# Patient Record
Sex: Female | Born: 1935 | Race: White | Hispanic: No | Smoking: Former smoker
Health system: Southern US, Community
[De-identification: ages and names within clinical notes are randomized; demographics above are authoritative.]

## PROBLEM LIST (undated history)

## (undated) DIAGNOSIS — E785 Hyperlipidemia, unspecified: Secondary | ICD-10-CM

## (undated) DIAGNOSIS — I10 Essential (primary) hypertension: Secondary | ICD-10-CM

## (undated) DIAGNOSIS — Z720 Tobacco use: Secondary | ICD-10-CM

## (undated) HISTORY — DX: Hyperlipidemia, unspecified: E78.5

## (undated) HISTORY — DX: Tobacco use: Z72.0

## (undated) HISTORY — DX: Essential (primary) hypertension: I10

---

## 2003-06-28 ENCOUNTER — Other Ambulatory Visit: Admission: RE | Admit: 2003-06-28 | Discharge: 2003-06-28 | Payer: Self-pay | Admitting: Dermatology

## 2003-08-02 ENCOUNTER — Other Ambulatory Visit: Admission: RE | Admit: 2003-08-02 | Discharge: 2003-08-02 | Payer: Self-pay | Admitting: Dermatology

## 2003-10-07 ENCOUNTER — Ambulatory Visit (HOSPITAL_COMMUNITY): Admission: RE | Admit: 2003-10-07 | Discharge: 2003-10-07 | Payer: Self-pay | Admitting: Family Medicine

## 2003-11-10 ENCOUNTER — Emergency Department (HOSPITAL_COMMUNITY): Admission: EM | Admit: 2003-11-10 | Discharge: 2003-11-10 | Payer: Self-pay | Admitting: Emergency Medicine

## 2005-10-30 ENCOUNTER — Ambulatory Visit (HOSPITAL_COMMUNITY): Admission: RE | Admit: 2005-10-30 | Discharge: 2005-10-30 | Payer: Self-pay | Admitting: Family Medicine

## 2007-05-23 ENCOUNTER — Emergency Department (HOSPITAL_COMMUNITY): Admission: EM | Admit: 2007-05-23 | Discharge: 2007-05-24 | Payer: Self-pay | Admitting: Emergency Medicine

## 2007-06-24 ENCOUNTER — Encounter (HOSPITAL_COMMUNITY): Admission: RE | Admit: 2007-06-24 | Discharge: 2007-07-24 | Payer: Self-pay | Admitting: Family Medicine

## 2007-10-18 ENCOUNTER — Emergency Department (HOSPITAL_COMMUNITY): Admission: EM | Admit: 2007-10-18 | Discharge: 2007-10-18 | Payer: Self-pay | Admitting: Emergency Medicine

## 2010-05-23 ENCOUNTER — Emergency Department (HOSPITAL_COMMUNITY): Admission: EM | Admit: 2010-05-23 | Discharge: 2010-05-23 | Payer: Self-pay | Admitting: Emergency Medicine

## 2011-09-13 ENCOUNTER — Other Ambulatory Visit (HOSPITAL_COMMUNITY): Payer: Self-pay | Admitting: Physician Assistant

## 2011-09-13 DIAGNOSIS — Z139 Encounter for screening, unspecified: Secondary | ICD-10-CM

## 2011-09-17 ENCOUNTER — Ambulatory Visit (HOSPITAL_COMMUNITY)
Admission: RE | Admit: 2011-09-17 | Discharge: 2011-09-17 | Disposition: A | Discharge status: Home / Self Care | Payer: Medicare Other | Source: Ambulatory Visit | Attending: Physician Assistant | Admitting: Physician Assistant

## 2011-09-17 DIAGNOSIS — Z139 Encounter for screening, unspecified: Secondary | ICD-10-CM

## 2011-09-17 DIAGNOSIS — Z780 Asymptomatic menopausal state: Secondary | ICD-10-CM | POA: Insufficient documentation

## 2011-09-17 DIAGNOSIS — M818 Other osteoporosis without current pathological fracture: Secondary | ICD-10-CM | POA: Insufficient documentation

## 2013-08-05 ENCOUNTER — Ambulatory Visit (INDEPENDENT_AMBULATORY_CARE_PROVIDER_SITE_OTHER): Payer: Medicare Other | Admitting: Cardiovascular Disease

## 2013-08-05 ENCOUNTER — Encounter: Payer: Self-pay | Admitting: Cardiovascular Disease

## 2013-08-05 VITALS — BP 162/100 | HR 71 | Ht 61.5 in | Wt 123.0 lb

## 2013-08-05 DIAGNOSIS — I10 Essential (primary) hypertension: Secondary | ICD-10-CM

## 2013-08-05 DIAGNOSIS — E785 Hyperlipidemia, unspecified: Secondary | ICD-10-CM

## 2013-08-05 NOTE — Assessment & Plan Note (Signed)
On statin therapy. Her most recent lipid profile performed 09/19/11 revealed a glucose of 132 and LDL 66 and HDL of 42

## 2013-08-05 NOTE — Progress Notes (Signed)
08/05/2013 Whitney [name]   1929-09-30  [id number]  Primary Physician [name], MD Primary Cardiologist: Whitney Gess MD Whitney [name]   HPI:  [name] is a very pleasant 79 year old thin-appearing divorced Caucasian female with no children, whose brother-in-law is also a [name] of mine. I last saw her a year ago. Her problems include hypertension, hyperlipidemia, and continued tobacco abuse. She otherwise remains asymptomatic and denies chest pain or shortness of breath. Recent blood work performed earlier this year revealed total cholesterol of 132, LDL of 66 and HDL of 42.    Current Outpatient Prescriptions  Medication Sig Dispense Refill  . aspirin 81 MG tablet Take 81 mg by mouth daily.      [name] atorvastatin (LIPITOR) 20 MG tablet Take 10 mg by mouth daily.      [name] BENICAR 40 MG tablet Take 40 mg by mouth daily.      . Calcium Carb-Cholecalciferol (CALCIUM 500 +D) 500-400 MG-UNIT TABS Take 1 tablet by mouth 2 (two) times daily.      . cloNIDine (CATAPRES) 0.1 MG tablet Take 0.1 mg by mouth 2 (two) times daily.      . diazepam (VALIUM) 5 MG tablet Take 5 mg by mouth as needed.      . doxazosin (CARDURA) 2 MG tablet Take 2 mg by mouth at bedtime.      . hydrochlorothiazide (HYDRODIURIL) 25 MG tablet Take 25 mg by mouth daily.      . metoprolol succinate (TOPROL-XL) 100 MG 24 hr tablet Take 100 mg by mouth daily.      . Omega-3 Fatty Acids (FISH OIL) 1000 MG CAPS Take 1,000 mg by mouth. One tablet in the morning and two tablets at night       No current facility-administered medications for this visit.    No Known Allergies  History   Social History  . Marital Status: Divorced    Spouse Name: N/A    Number of Children: N/A  . Years of Education: N/A   Occupational History  . Not on file.   Social History Main Topics  . Smoking status: Current Every Day Smoker -- 1.00 packs/day    Types: Cigarettes  . Smokeless tobacco: Not on file    . Alcohol Use: Not on file  . Drug Use: Not on file  . Sexual Activity: Not on file   Other Topics Concern  . Not on file   Social History Narrative  . No narrative on file     Review of Systems: General: negative for chills, fever, night sweats or weight changes.  Cardiovascular: negative for chest pain, dyspnea on exertion, edema, orthopnea, palpitations, paroxysmal nocturnal dyspnea or shortness of breath Dermatological: negative for rash Respiratory: negative for cough or wheezing Urologic: negative for hematuria Abdominal: negative for nausea, vomiting, diarrhea, bright red blood per rectum, melena, or hematemesis Neurologic: negative for visual changes, syncope, or dizziness All other systems reviewed and are otherwise negative except as noted above.    Blood pressure 162/100, pulse 71, height 5' 1.5" (1.562 m), weight 123 lb (55.792 kg).  General appearance: alert and no distress Neck: no adenopathy, no carotid bruit, no JVD, supple, symmetrical, trachea midline and thyroid not enlarged, symmetric, no tenderness/mass/nodules Lungs: clear to auscultation bilaterally Heart: regular rate and rhythm, S1, S2 normal, no murmur, click, rub or gallop Extremities: extremities normal, atraumatic, no cyanosis or edema  EKG normal sinus rhythm at 71 with incomplete right bundle branch block  and left anterior fascicular block, and septal Q waves  ASSESSMENT AND PLAN:   Essential hypertension Her blood pressure is elevated today but she says this is a result of "white coat hypertension.  Hyperlipidemia On statin therapy. Her most recent lipid profile performed 09/19/11 revealed a glucose of 132 and LDL 66 and HDL of 42      Whitney Gess MD [facility], [facility] 08/05/2013 2:07 PM

## 2013-08-05 NOTE — Patient Instructions (Signed)
Your physician wants you to follow-up in: 1 year with Dr Berry. You will receive a reminder letter in the mail two months in advance. If you don't receive a letter, please call our office to schedule the follow-up appointment.  

## 2013-08-05 NOTE — Assessment & Plan Note (Signed)
Her blood pressure is elevated today but she says this is a result of "white coat hypertension.

## 2013-08-06 ENCOUNTER — Encounter: Payer: Self-pay | Admitting: Cardiovascular Disease

## 2013-12-25 ENCOUNTER — Emergency Department (HOSPITAL_COMMUNITY): Payer: Medicare Other

## 2013-12-25 ENCOUNTER — Encounter (HOSPITAL_COMMUNITY): Payer: Self-pay | Admitting: Emergency Medicine

## 2013-12-25 ENCOUNTER — Inpatient Hospital Stay (HOSPITAL_COMMUNITY)
Admission: EM | Admit: 2013-12-25 | Discharge: 2013-12-30 | DRG: 469 | Disposition: A | Discharge status: Skilled Nursing Facility | Payer: Medicare Other | Attending: Internal Medicine | Admitting: Internal Medicine

## 2013-12-25 DIAGNOSIS — Z720 Tobacco use: Secondary | ICD-10-CM

## 2013-12-25 DIAGNOSIS — E785 Hyperlipidemia, unspecified: Secondary | ICD-10-CM | POA: Diagnosis present

## 2013-12-25 DIAGNOSIS — Z01818 Encounter for other preprocedural examination: Secondary | ICD-10-CM

## 2013-12-25 DIAGNOSIS — W108XXA Fall (on) (from) other stairs and steps, initial encounter: Secondary | ICD-10-CM | POA: Diagnosis present

## 2013-12-25 DIAGNOSIS — J9601 Acute respiratory failure with hypoxia: Secondary | ICD-10-CM

## 2013-12-25 DIAGNOSIS — E8779 Other fluid overload: Secondary | ICD-10-CM | POA: Diagnosis not present

## 2013-12-25 DIAGNOSIS — Z79899 Other long term (current) drug therapy: Secondary | ICD-10-CM

## 2013-12-25 DIAGNOSIS — J9600 Acute respiratory failure, unspecified whether with hypoxia or hypercapnia: Secondary | ICD-10-CM | POA: Diagnosis not present

## 2013-12-25 DIAGNOSIS — S72002A Fracture of unspecified part of neck of left femur, initial encounter for closed fracture: Secondary | ICD-10-CM | POA: Diagnosis present

## 2013-12-25 DIAGNOSIS — R0902 Hypoxemia: Secondary | ICD-10-CM

## 2013-12-25 DIAGNOSIS — Z7982 Long term (current) use of aspirin: Secondary | ICD-10-CM

## 2013-12-25 DIAGNOSIS — I10 Essential (primary) hypertension: Secondary | ICD-10-CM

## 2013-12-25 DIAGNOSIS — J449 Chronic obstructive pulmonary disease, unspecified: Secondary | ICD-10-CM | POA: Diagnosis present

## 2013-12-25 DIAGNOSIS — D62 Acute posthemorrhagic anemia: Secondary | ICD-10-CM | POA: Diagnosis not present

## 2013-12-25 DIAGNOSIS — S72033A Displaced midcervical fracture of unspecified femur, initial encounter for closed fracture: Principal | ICD-10-CM | POA: Diagnosis present

## 2013-12-25 DIAGNOSIS — Y92009 Unspecified place in unspecified non-institutional (private) residence as the place of occurrence of the external cause: Secondary | ICD-10-CM

## 2013-12-25 DIAGNOSIS — F17200 Nicotine dependence, unspecified, uncomplicated: Secondary | ICD-10-CM | POA: Diagnosis present

## 2013-12-25 DIAGNOSIS — Z833 Family history of diabetes mellitus: Secondary | ICD-10-CM

## 2013-12-25 DIAGNOSIS — S72009A Fracture of unspecified part of neck of unspecified femur, initial encounter for closed fracture: Secondary | ICD-10-CM

## 2013-12-25 DIAGNOSIS — D500 Iron deficiency anemia secondary to blood loss (chronic): Secondary | ICD-10-CM | POA: Diagnosis not present

## 2013-12-25 DIAGNOSIS — J4489 Other specified chronic obstructive pulmonary disease: Secondary | ICD-10-CM | POA: Diagnosis present

## 2013-12-25 LAB — COMPREHENSIVE METABOLIC PANEL
ALT: 15 U/L (ref 0–35)
AST: 22 U/L (ref 0–37)
Albumin: 3.9 g/dL (ref 3.5–5.2)
Alkaline Phosphatase: 74 U/L (ref 39–117)
BILIRUBIN TOTAL: 0.6 mg/dL (ref 0.3–1.2)
BUN: 16 mg/dL (ref 6–23)
CHLORIDE: 93 meq/L — AB (ref 96–112)
CO2: 31 meq/L (ref 19–32)
CREATININE: 0.57 mg/dL (ref 0.50–1.10)
Calcium: 9.7 mg/dL (ref 8.4–10.5)
GFR calc Af Amer: >90 mL/min (ref >90)
GFR, EST NON AFRICAN AMERICAN: 85 mL/min — AB (ref >90)
GLUCOSE: 115 mg/dL — AB (ref 70–99)
Potassium: 3.4 mEq/L — ABNORMAL LOW (ref 3.7–5.3)
Sodium: 136 mEq/L — ABNORMAL LOW (ref 137–147)
Total Protein: 7.4 g/dL (ref 6.0–8.3)

## 2013-12-25 LAB — URINALYSIS, ROUTINE W REFLEX MICROSCOPIC
Bilirubin Urine: NEGATIVE
GLUCOSE, UA: NEGATIVE mg/dL
Hgb urine dipstick: NEGATIVE
Ketones, ur: NEGATIVE mg/dL
LEUKOCYTES UA: NEGATIVE
Nitrite: NEGATIVE
PH: 6 (ref 5.0–8.0)
Protein, ur: NEGATIVE mg/dL
Specific Gravity, Urine: 1.02 (ref 1.005–1.030)
Urobilinogen, UA: 0.2 mg/dL (ref 0.0–1.0)

## 2013-12-25 LAB — CBC WITH DIFFERENTIAL/PLATELET
Basophils Absolute: 0 10*3/uL (ref 0.0–0.1)
Basophils Relative: 0 % (ref 0–1)
EOS PCT: 1 % (ref 0–5)
Eosinophils Absolute: 0.1 10*3/uL (ref 0.0–0.7)
HEMATOCRIT: 39.1 % (ref 36.0–46.0)
HEMOGLOBIN: 13.9 g/dL (ref 12.0–15.0)
LYMPHS ABS: 1.2 10*3/uL (ref 0.7–4.0)
LYMPHS PCT: 11 % — AB (ref 12–46)
MCH: 32.3 pg (ref 26.0–34.0)
MCHC: 35.5 g/dL (ref 30.0–36.0)
MCV: 90.7 fL (ref 78.0–100.0)
MONOS PCT: 7 % (ref 3–12)
Monocytes Absolute: 0.8 10*3/uL (ref 0.1–1.0)
Neutro Abs: 8.2 10*3/uL — ABNORMAL HIGH (ref 1.7–7.7)
Neutrophils Relative %: 80 % — ABNORMAL HIGH (ref 43–77)
PLATELETS: 235 10*3/uL (ref 150–400)
RBC: 4.31 MIL/uL (ref 3.87–5.11)
RDW: 12.4 % (ref 11.5–15.5)
WBC: 10.3 10*3/uL (ref 4.0–10.5)

## 2013-12-25 LAB — SAMPLE TO BLOOD BANK

## 2013-12-25 LAB — APTT: aPTT: 28 seconds (ref 24–37)

## 2013-12-25 LAB — PREPARE RBC (CROSSMATCH)

## 2013-12-25 LAB — PROTIME-INR
INR: 1.06 (ref 0.00–1.49)
PROTHROMBIN TIME: 13.6 s (ref 11.6–15.2)

## 2013-12-25 LAB — ABO/RH: ABO/RH(D): A NEG

## 2013-12-25 MED ORDER — CLONIDINE HCL 0.2 MG PO TABS
0.2000 mg | ORAL_TABLET | Freq: Every day | ORAL | Status: DC
  Administered 2013-12-26 – 2013-12-30 (×5): 0.2 mg via ORAL
  Filled 2013-12-25 (×5): qty 1

## 2013-12-25 MED ORDER — ONDANSETRON HCL 4 MG/2ML IJ SOLN
4.0000 mg | Freq: Four times a day (QID) | INTRAMUSCULAR | Status: DC | PRN
  Administered 2013-12-26 – 2013-12-27 (×2): 4 mg via INTRAVENOUS
  Filled 2013-12-25 (×2): qty 2

## 2013-12-25 MED ORDER — ONDANSETRON HCL 4 MG/2ML IJ SOLN
4.0000 mg | Freq: Once | INTRAMUSCULAR | Status: AC
  Administered 2013-12-25: 4 mg via INTRAVENOUS
  Filled 2013-12-25: qty 2

## 2013-12-25 MED ORDER — CLONIDINE HCL 0.1 MG PO TABS
0.1000 mg | ORAL_TABLET | Freq: Every day | ORAL | Status: DC
  Administered 2013-12-26 – 2013-12-29 (×4): 0.1 mg via ORAL
  Filled 2013-12-25 (×4): qty 1

## 2013-12-25 MED ORDER — HYDROMORPHONE HCL PF 1 MG/ML IJ SOLN: 0.5000 mg | Freq: Once | INTRAMUSCULAR | Status: DC

## 2013-12-25 MED ORDER — POVIDONE-IODINE 10 % EX SOLN
Freq: Once | CUTANEOUS | Status: AC
  Administered 2013-12-25: 22:00:00 via TOPICAL
  Filled 2013-12-25: qty 118

## 2013-12-25 MED ORDER — DOXAZOSIN MESYLATE 2 MG PO TABS
2.0000 mg | ORAL_TABLET | Freq: Every day | ORAL | Status: DC
  Administered 2013-12-25 – 2013-12-29 (×5): 2 mg via ORAL
  Filled 2013-12-25 (×5): qty 1

## 2013-12-25 MED ORDER — HYDRALAZINE HCL 20 MG/ML IJ SOLN: 10.0000 mg | Freq: Four times a day (QID) | INTRAMUSCULAR | Status: DC | PRN

## 2013-12-25 MED ORDER — MORPHINE SULFATE 4 MG/ML IJ SOLN
4.0000 mg | Freq: Once | INTRAMUSCULAR | Status: AC
  Administered 2013-12-25: 4 mg via INTRAVENOUS
  Filled 2013-12-25: qty 1

## 2013-12-25 MED ORDER — ASPIRIN EC 81 MG PO TBEC
81.0000 mg | DELAYED_RELEASE_TABLET | Freq: Every morning | ORAL | Status: DC
  Administered 2013-12-26 – 2013-12-30 (×5): 81 mg via ORAL
  Filled 2013-12-25 (×5): qty 1

## 2013-12-25 MED ORDER — SODIUM CHLORIDE 0.9 % IV SOLN
INTRAVENOUS | Status: DC
  Administered 2013-12-25 – 2013-12-28 (×6): via INTRAVENOUS

## 2013-12-25 MED ORDER — SODIUM CHLORIDE 0.9 % IV SOLN
INTRAVENOUS | Status: DC
  Administered 2013-12-25: 17:00:00 via INTRAVENOUS

## 2013-12-25 MED ORDER — PROMETHAZINE HCL 25 MG/ML IJ SOLN
12.5000 mg | INTRAMUSCULAR | Status: DC | PRN
Start: 2013-12-25 — End: 2013-12-30
  Administered 2013-12-25: 12.5 mg via INTRAMUSCULAR
  Filled 2013-12-25: qty 1

## 2013-12-25 MED ORDER — MORPHINE SULFATE 2 MG/ML IJ SOLN
0.5000 mg | INTRAMUSCULAR | Status: DC | PRN
  Administered 2013-12-25 – 2013-12-26 (×3): 0.5 mg via INTRAVENOUS
  Filled 2013-12-25 (×3): qty 1

## 2013-12-25 MED ORDER — DIAZEPAM 5 MG PO TABS
5.0000 mg | ORAL_TABLET | Freq: Every day | ORAL | Status: DC | PRN
  Filled 2013-12-25 (×2): qty 1

## 2013-12-25 MED ORDER — METOPROLOL SUCCINATE ER 50 MG PO TB24
100.0000 mg | ORAL_TABLET | Freq: Every morning | ORAL | Status: DC
  Administered 2013-12-26 – 2013-12-30 (×5): 100 mg via ORAL
  Filled 2013-12-25 (×5): qty 2

## 2013-12-25 MED ORDER — ATORVASTATIN CALCIUM 10 MG PO TABS
10.0000 mg | ORAL_TABLET | Freq: Every day | ORAL | Status: DC
  Administered 2013-12-25 – 2013-12-29 (×5): 10 mg via ORAL
  Filled 2013-12-25 (×5): qty 1

## 2013-12-25 MED ORDER — POTASSIUM CHLORIDE 10 MEQ/100ML IV SOLN
10.0000 meq | INTRAVENOUS | Status: AC
  Administered 2013-12-25 – 2013-12-26 (×4): 10 meq via INTRAVENOUS
  Filled 2013-12-25 (×4): qty 100

## 2013-12-25 MED ORDER — NICOTINE 14 MG/24HR TD PT24
14.0000 mg | MEDICATED_PATCH | Freq: Every day | TRANSDERMAL | Status: DC
  Administered 2013-12-25 – 2013-12-30 (×6): 14 mg via TRANSDERMAL
  Filled 2013-12-25 (×7): qty 1

## 2013-12-25 MED ORDER — HYDROCODONE-ACETAMINOPHEN 5-325 MG PO TABS
1.0000 | ORAL_TABLET | Freq: Four times a day (QID) | ORAL | Status: DC | PRN
  Administered 2013-12-26 – 2013-12-28 (×5): 2 via ORAL
  Administered 2013-12-29 – 2013-12-30 (×2): 1 via ORAL
  Filled 2013-12-25 (×3): qty 2
  Filled 2013-12-25: qty 1
  Filled 2013-12-25: qty 2
  Filled 2013-12-25: qty 1
  Filled 2013-12-25: qty 2

## 2013-12-25 MED ORDER — CEFAZOLIN SODIUM-DEXTROSE 2-3 GM-% IV SOLR
2.0000 g | Freq: Once | INTRAVENOUS | Status: AC
  Administered 2013-12-26: 2 g via INTRAVENOUS
  Filled 2013-12-25 (×2): qty 50

## 2013-12-25 MED ORDER — IRBESARTAN 300 MG PO TABS
300.0000 mg | ORAL_TABLET | Freq: Every day | ORAL | Status: DC
  Administered 2013-12-26 – 2013-12-30 (×5): 300 mg via ORAL
  Filled 2013-12-25 (×5): qty 1

## 2013-12-25 MED ORDER — ACETAMINOPHEN 325 MG PO TABS: 650.0000 mg | ORAL_TABLET | Freq: Four times a day (QID) | ORAL | Status: DC | PRN

## 2013-12-25 NOTE — ED Notes (Signed)
Pt states her pain is coming back and it is real bad .  More orders received from dr. Lynelle DoctorKnapp.

## 2013-12-25 NOTE — ED Notes (Signed)
Placed on Lasara at 3 lpm

## 2013-12-25 NOTE — ED Notes (Signed)
Fell on left hip at 1530pm.  2/10 left hip pain.

## 2013-12-25 NOTE — ED Provider Notes (Signed)
CSN: [id number]     Arrival date & time 12/25/13  1636 History  This chart was scribed for Ward GivensIva L Damaria Stofko, MD by Dorothey Basemania Sutton, ED Scribe. This patient was seen in room APA05/APA05 and the patient's care was started at 4:58 PM.    Chief Complaint  Patient presents with  . Hip Pain   The history is provided by the patient. No language interpreter was used.   HPI Comments: Whitney [name] is a 80 y.o. female brought in by EMS who presents to the Emergency Department complaining of a fall that occurred around 1.5 hours ago when she reports that she tripped on the stairs of her back porch while carrying a tray of food, causing her to land on her left side. She denies hitting her head or loss of consciousness. Patient is complaining of a constant pain to the left hip secondary to the incident and states that she has not been ambulatory due to pain. She states that EMS did not administer any medications en route to the ED. She denies headache or other injuries. Patient has a history of HTN and hyperlipidemia. Patient is a current every day smoker, 1 PPD, but does not drink alcohol. Patient reports her last PO intake was around 10:00 AM, or 7 hours ago, but that she has had a few sips of water since then.   PCP- Dr. Assunta FoundJohn Golding Cardiologist- Dr. Nanetta BattyJonathan Berry   Past Medical History  Diagnosis Date  . Hypertension   . Hyperlipidemia   . Tobacco abuse    History reviewed. No pertinent past surgical history. Family History  Problem Relation Age of Onset  . Diabetes Sister    History  Substance Use Topics  . Smoking status: Current Every Day Smoker -- 1.00 packs/day for 50 years    Types: Cigarettes  . Smokeless tobacco: Not on file  . Alcohol Use: No   Lives alone  OB History   Grav Para Term Preterm Abortions TAB SAB Ect Mult Living                 Review of Systems  Musculoskeletal: Positive for arthralgias.  Neurological: Negative for headaches.  All other systems reviewed and are  negative.  Allergies  Prednisone  Home Medications   Prior to Admission medications   Medication Sig Start Date End Date Taking? Authorizing Provider  aspirin 81 MG tablet Take 81 mg by mouth daily.    Historical Provider, MD  atorvastatin (LIPITOR) 20 MG tablet Take 10 mg by mouth daily.    Historical Provider, MD  BENICAR 40 MG tablet Take 40 mg by mouth daily. 07/23/13   Historical Provider, MD  Calcium Carb-Cholecalciferol (CALCIUM 500 +D) 500-400 MG-UNIT TABS Take 1 tablet by mouth 2 (two) times daily.    Historical Provider, MD  cloNIDine (CATAPRES) 0.1 MG tablet Take 0.1 mg by mouth 2 (two) times daily. 07/14/13   Historical Provider, MD  diazepam (VALIUM) 5 MG tablet Take 5 mg by mouth as needed. 06/22/13   Historical Provider, MD  doxazosin (CARDURA) 2 MG tablet Take 2 mg by mouth at bedtime. 07/14/13   Historical Provider, MD  hydrochlorothiazide (HYDRODIURIL) 25 MG tablet Take 25 mg by mouth daily. 06/22/13   Historical Provider, MD  metoprolol succinate (TOPROL-XL) 100 MG 24 hr tablet Take 100 mg by mouth daily. 07/14/13   Historical Provider, MD  Omega-3 Fatty Acids (FISH OIL) 1000 MG CAPS Take 1,000 mg by mouth. One tablet in the morning and  two tablets at night    Historical Provider, MD   Triage Vitals: BP 216/93  Pulse 81  Temp(Src) 98.2 F (36.8 C) (Oral)  Resp 16  Ht 5\' 4"  (1.626 m)  Wt 122 lb (55.339 kg)  BMI 20.93 kg/m2  SpO2 93%  Vital signs normal except for hypertension   Physical Exam  Nursing note and vitals reviewed. Constitutional: She is oriented to person, place, and time. She appears well-developed and well-nourished.  Non-toxic appearance. She does not appear ill. No distress.  HENT:  Head: Normocephalic and atraumatic.  Right Ear: External ear normal.  Left Ear: External ear normal.  Nose: Nose normal. No mucosal edema or rhinorrhea.  Mouth/Throat: Oropharynx is clear and moist and mucous membranes are normal. No dental abscesses or uvula  swelling.  Eyes: Conjunctivae and EOM are normal. Pupils are equal, round, and reactive to light.  Neck: Normal range of motion and full passive range of motion without pain. Neck supple.  Cardiovascular: Normal rate, regular rhythm and normal heart sounds.  Exam reveals no gallop and no friction rub.   No murmur heard. Pulmonary/Chest: Effort normal and breath sounds normal. No respiratory distress. She has no wheezes. She has no rhonchi. She has no rales. She exhibits no tenderness and no crepitus.  Abdominal: Soft. Normal appearance and bowel sounds are normal. She exhibits no distension. There is no tenderness. There is no rebound and no guarding.  Musculoskeletal: Normal range of motion. She exhibits tenderness. She exhibits no edema.  Shortening of the left leg. No external or internal rotation. Tenderness to the left hip area with mild swelling.   Neurological: She is alert and oriented to person, place, and time. She has normal strength. No cranial nerve deficit.  Skin: Skin is warm, dry and intact. No rash noted. No erythema. No pallor.  Psychiatric: She has a normal mood and affect. Her speech is normal and behavior is normal. Her mood appears not anxious.    ED Course  Procedures (including critical care time)  Medications  morphine 4 MG/ML injection 4 mg (4 mg Intravenous Given 12/25/13 1718)  ondansetron (ZOFRAN) injection 4 mg (4 mg Intravenous Given 12/25/13 1718)  morphine 4 MG/ML injection 4 mg (4 mg Intravenous Given 12/25/13 1816)  morphine 4 MG/ML injection 4 mg (4 mg Intravenous Given 12/25/13 1901)  povidone-iodine (BETADINE) 10 % external solution ( Topical Given 12/25/13 2200)   DIAGNOSTIC STUDIES: Oxygen Saturation is 93% on room air, adequate by my interpretation.    COORDINATION OF CARE: 5:06 PM- Will order UA (via foley catheter), CBC, CMP, APTT, Protime-INR, and x-rays of the chest and left hip. Will order IV fluids, morphine, and Zofran to manage symptoms. Patient  has been NPO since 10:00 AM or around 7 hours ago, but has had a few sips of water since then. Discussed treatment plan with patient at bedside and patient verbalized agreement.   6:32 PM- Discussed that imaging results indicate a fracture in the left hip. Discussed that the patient will need to be admitted tot he hospital and will require a hip replacement to repair the injury. Will consult with Dr. Hilda Lias. Discussed that lab results and chest x-ray are relatively normal. Discussed treatment plan with patient at bedside and patient verbalized agreement.   18:40 Dr Hilda Lias, will do surgery once medicine has cleared patient, most likely in am  7:06 PM- Consulted with Dr. Barnie Del who will admit the patient to the med surge floor, team one.  Results for orders placed during the hospital encounter of 12/25/13  CBC WITH DIFFERENTIAL      Result Value Ref Range   WBC 10.3  4.0 - 10.5 K/uL   RBC 4.31  3.87 - 5.11 MIL/uL   Hemoglobin 13.9  12.0 - 15.0 g/dL   HCT 16.139.1  09.636.0 - 04.546.0 %   MCV 90.7  78.0 - 100.0 fL   MCH 32.3  26.0 - 34.0 pg   MCHC 35.5  30.0 - 36.0 g/dL   RDW 40.912.4  81.111.5 - 91.415.5 %   Platelets 235  150 - 400 K/uL   Neutrophils Relative % 80 (*) 43 - 77 %   Neutro Abs 8.2 (*) 1.7 - 7.7 K/uL   Lymphocytes Relative 11 (*) 12 - 46 %   Lymphs Abs 1.2  0.7 - 4.0 K/uL   Monocytes Relative 7  3 - 12 %   Monocytes Absolute 0.8  0.1 - 1.0 K/uL   Eosinophils Relative 1  0 - 5 %   Eosinophils Absolute 0.1  0.0 - 0.7 K/uL   Basophils Relative 0  0 - 1 %   Basophils Absolute 0.0  0.0 - 0.1 K/uL   WBC Morphology ATYPICAL LYMPHOCYTES    COMPREHENSIVE METABOLIC PANEL      Result Value Ref Range   Sodium 136 (*) 137 - 147 mEq/L   Potassium 3.4 (*) 3.7 - 5.3 mEq/L   Chloride 93 (*) 96 - 112 mEq/L   CO2 31  19 - 32 mEq/L   Glucose, Bld 115 (*) 70 - 99 mg/dL   BUN 16  6 - 23 mg/dL   Creatinine, Ser 7.820.57  0.50 - 1.10 mg/dL   Calcium 9.7  8.4 - 95.610.5 mg/dL   Total Protein 7.4  6.0 - 8.3  g/dL   Albumin 3.9  3.5 - 5.2 g/dL   AST 22  0 - 37 U/L   ALT 15  0 - 35 U/L   Alkaline Phosphatase 74  39 - 117 U/L   Total Bilirubin 0.6  0.3 - 1.2 mg/dL   GFR calc non Af Amer 85 (*) >90 mL/min   GFR calc Af Amer >90  >90 mL/min  APTT      Result Value Ref Range   aPTT 28  24 - 37 seconds  PROTIME-INR      Result Value Ref Range   Prothrombin Time 13.6  11.6 - 15.2 seconds   INR 1.06  0.00 - 1.49  URINALYSIS, ROUTINE W REFLEX MICROSCOPIC      Result Value Ref Range   Color, Urine YELLOW  YELLOW   APPearance CLEAR  CLEAR   Specific Gravity, Urine 1.020  1.005 - 1.030   pH 6.0  5.0 - 8.0   Glucose, UA NEGATIVE  NEGATIVE mg/dL   Hgb urine dipstick NEGATIVE  NEGATIVE   Bilirubin Urine NEGATIVE  NEGATIVE   Ketones, ur NEGATIVE  NEGATIVE mg/dL   Protein, ur NEGATIVE  NEGATIVE mg/dL   Urobilinogen, UA 0.2  0.0 - 1.0 mg/dL   Nitrite NEGATIVE  NEGATIVE   Leukocytes, UA NEGATIVE  NEGATIVE  SAMPLE TO BLOOD BANK      Result Value Ref Range   Blood Bank Specimen [id number]     Sample Expiration 12/26/2013    PREPARE RBC (CROSSMATCH)      Result Value Ref Range   Order Confirmation ORDER PROCESSED BY BLOOD BANK    TYPE AND SCREEN      Result Value  Ref Range   ABO/RH(D) A NEG     Antibody Screen NEG     Sample Expiration 12/28/2013     Unit Number [id number]     Blood Component Type RED CELLS,LR     Unit division 00     Status of Unit ALLOCATED     Transfusion Status OK TO TRANSFUSE     Crossmatch Result Compatible     Unit Number [id number]     Blood Component Type RED CELLS,LR     Unit division 00     Status of Unit ALLOCATED     Transfusion Status OK TO TRANSFUSE     Crossmatch Result Compatible    ABO/RH      Result Value Ref Range   ABO/RH(D) A NEG     Laboratory interpretation all normal except hyponatremia   Dg Hip Complete Left  12/25/2013   CLINICAL DATA:  Fall, left hip pain  EXAM: LEFT HIP - COMPLETE 2+ VIEW  COMPARISON:  None.  FINDINGS: Impacted  transcervical left femoral neck fracture. There is marked foreshortening of the femoral neck. The femoral head component remains located within the acetabulum. The bony pelvis appears intact. The visualized proximal right femur is unremarkable. Degenerative disc disease and levoconvex scoliosis incompletely seen in the low lumbar spine. Normal bony mineralization for age. No discrete osseous lesion.  IMPRESSION: Impacted transcervical left femoral neck fracture with marked foreshortening of the femoral neck.   Electronically Signed   By: Malachy Moan M.D.   On: 12/25/2013 17:48   Dg Chest Portable 1 View  12/25/2013   CLINICAL DATA:  Fall, left hip pain, preoperative chest x-ray  EXAM: PORTABLE CHEST - 1 VIEW  COMPARISON:  Prior chest x-ray 12/27/2011  FINDINGS: Stable cardiac and mediastinal contours. Persistent mild cardiomegaly. Atherosclerotic and tortuous thoracic aorta. Central bronchitic changes and interstitial prominence are similar compared to prior. No over pulmonary edema, pleural effusion, pneumothorax or suspicious nodular mass. No focal airspace consolidation. No acute osseous abnormality.  IMPRESSION: No active disease.  Chronic bronchitic changes and interstitial prominence.  Tortuous and atherosclerotic thoracic aorta.  Borderline cardiomegaly.   Electronically Signed   By: Malachy Moan M.D.   On: 12/25/2013 17:46    EKG Interpretation   Date/Time:  Friday December 25 2013 18:40:05 EDT Ventricular Rate:  78 PR Interval:  174 QRS Duration: 88 QT Interval:  376 QTC Calculation: 428 R Axis:   -72 Text Interpretation:  Normal sinus rhythm Left axis deviation Anteroseptal  infarct , age undetermined No previous ECGs available Confirmed by Rice Walsh   MD-I, Timtohy Broski ([id number]) on 12/26/2013 1:08:37 AM      MDM   Final diagnoses:  Fracture of femoral neck, left, closed    Plan admission   Devoria Albe, MD, FACEP   I personally performed the services described in this documentation,  which was scribed in my presence. The recorded information has been reviewed and considered.  Devoria Albe, MD, FACEP     Ward Givens, MD 12/26/13 [phone]

## 2013-12-25 NOTE — H&P (Signed)
[facility] History and Physical  Whitney [name] HDQ:[id number] DOB: Nov 03, 1929 DOA: 12/25/2013   PCP: Purvis Kilts, MD  Specialists: Is followed by Dr. Quay Burow for hypertension  Chief Complaint: Left hip pain  HPI: Whitney [name] is a 80 y.o. female with a past medical history of hypertension, and hyperlipidemia, who was in her usual state of health till earlier this afternoon when she was carrying food on a tray to take to the church. She had to walk down 2 steps in her back porch. She stumbled and fell on her left side. Denies any syncopal episode or near syncope. No chest pain or shortness of breath either prior to or after the episode. She experienced excruciating pain in the left hip but was able to pull herself into the house and called 911. She was ultimately brought into the hospital and diagnosed with a left hip fracture. She denies any chest pain with usual activities. She does get short of breath, sometimes, which she attributes to her smoking. She's had stress tests in the past, which has been negative, but none recently. Denies any history of heart disease. No diabetes. Pain is currently 8/10.  Home Medications: Prior to Admission medications   Medication Sig Start Date End Date Taking? Authorizing Provider  aspirin EC 81 MG tablet Take 81 mg by mouth every morning.    Yes Historical Provider, MD  atorvastatin (LIPITOR) 20 MG tablet Take 10 mg by mouth at bedtime.    Yes Historical Provider, MD  BENICAR 40 MG tablet Take 40 mg by mouth daily. 07/23/13  Yes Historical Provider, MD  Calcium Carb-Cholecalciferol (CALCIUM 500 +D) 500-400 MG-UNIT TABS Take 1 tablet by mouth at bedtime.    Yes Historical Provider, MD  cloNIDine (CATAPRES) 0.1 MG tablet Take 0.1-0.2 mg by mouth 2 (two) times daily. Two tablets taken in the morning and one tablet at super 07/14/13  Yes Historical Provider, MD  diazepam (VALIUM) 5 MG tablet Take 5 mg by mouth daily as needed  for anxiety.  06/22/13  Yes Historical Provider, MD  doxazosin (CARDURA) 2 MG tablet Take 2 mg by mouth at bedtime. 07/14/13  Yes Historical Provider, MD  hydrochlorothiazide (HYDRODIURIL) 25 MG tablet Take 25 mg by mouth every morning.  06/22/13  Yes Historical Provider, MD  metoprolol succinate (TOPROL-XL) 100 MG 24 hr tablet Take 100 mg by mouth every morning.  07/14/13  Yes Historical Provider, MD  Omega-3 Fatty Acids (FISH OIL) 1000 MG CAPS Take 1,000 mg by mouth at bedtime.    Yes Historical Provider, MD    Allergies:  Allergies  Allergen Reactions  . Prednisone Other (See Comments)    Increased Blood pressure levels    Past Medical History: Past Medical History  Diagnosis Date  . Hypertension   . Hyperlipidemia   . Tobacco abuse     History reviewed. No pertinent past surgical history.  Social History: She lives by herself in [place]. Smokes one pack of cigarettes on a daily basis. Has a 50-pack-year history of smoking. No alcohol use illicit drug use. Independent in daily activities usually. 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  Family History:  Family History  Problem Relation Age of Onset  . Diabetes Sister    There is also history of cancer and heart disease in the rest of the family members.  Review of Systems - History obtained from the patient General ROS: negative Psychological ROS: negative Ophthalmic ROS: negative ENT ROS: negative  Allergy and Immunology ROS: negative Hematological and Lymphatic ROS: negative Endocrine ROS: negative Respiratory ROS: positive for - shortness of breath at times related to smoking Cardiovascular ROS: no chest pain or dyspnea on exertion Gastrointestinal ROS: no abdominal pain, change in bowel habits, or black or bloody stools Genito-Urinary ROS: no dysuria, trouble voiding, or hematuria Musculoskeletal ROS: as in hpi Neurological ROS: no TIA or stroke symptoms Dermatological ROS:  negative  Physical Examination  Filed Vitals:   12/25/13 1723 12/25/13 1728 12/25/13 1833 12/25/13 1900  BP:   168/70 135/62  Pulse: 78 70 75 79  Temp:      TempSrc:      Resp:   20   Height:      Weight:      SpO2: 85% 98% 100% 93%    BP 135/62  Pulse 79  Temp(Src) 98.2 F (36.8 C) (Oral)  Resp 20  Ht 5' 4"  (1.626 m)  Wt 55.339 kg (122 lb)  BMI 20.93 kg/m2  SpO2 93%  General appearance: alert, cooperative, appears stated age and no distress Head: Normocephalic, without obvious abnormality, atraumatic Eyes: conjunctivae/corneas clear. PERRL, EOM's intact.  Throat: lips, mucosa, and tongue normal; teeth and gums normal Neck: no adenopathy, no carotid bruit, no JVD, supple, symmetrical, trachea midline and thyroid not enlarged, symmetric, no tenderness/mass/nodules Resp: clear to auscultation bilaterally Cardio: regular rate and rhythm, S1, S2 normal, no murmur, click, rub or gallop GI: soft, non-tender; bowel sounds normal; no masses,  no organomegaly Extremities: extremities normal, atraumatic, no cyanosis or edema. Left lower extremity is externally rotated. Pulses: 2+ and symmetric Skin: Skin color, texture, turgor normal. No rashes or lesions Neurologic: No focal deficits. She is alert and oriented x3.  Laboratory Data: Results for orders placed during the hospital encounter of 12/25/13 (from the past 48 hour(s))  CBC WITH DIFFERENTIAL     Status: Abnormal   Collection Time    12/25/13  5:21 PM      Result Value Ref Range   WBC 10.3  4.0 - 10.5 K/uL   RBC 4.31  3.87 - 5.11 MIL/uL   Hemoglobin 13.9  12.0 - 15.0 g/dL   HCT 39.1  36.0 - 46.0 %   MCV 90.7  78.0 - 100.0 fL   MCH 32.3  26.0 - 34.0 pg   MCHC 35.5  30.0 - 36.0 g/dL   RDW 12.4  11.5 - 15.5 %   Platelets 235  150 - 400 K/uL   Neutrophils Relative % 80 (*) 43 - 77 %   Neutro Abs 8.2 (*) 1.7 - 7.7 K/uL   Lymphocytes Relative 11 (*) 12 - 46 %   Lymphs Abs 1.2  0.7 - 4.0 K/uL   Monocytes Relative 7  3 -  12 %   Monocytes Absolute 0.8  0.1 - 1.0 K/uL   Eosinophils Relative 1  0 - 5 %   Eosinophils Absolute 0.1  0.0 - 0.7 K/uL   Basophils Relative 0  0 - 1 %   Basophils Absolute 0.0  0.0 - 0.1 K/uL   WBC Morphology ATYPICAL LYMPHOCYTES    COMPREHENSIVE METABOLIC PANEL     Status: Abnormal   Collection Time    12/25/13  5:21 PM      Result Value Ref Range   Sodium 136 (*) 137 - 147 mEq/L   Potassium 3.4 (*) 3.7 - 5.3 mEq/L   Chloride 93 (*) 96 - 112 mEq/L   CO2 31  19 - 32 mEq/L  Glucose, Bld 115 (*) 70 - 99 mg/dL   BUN 16  6 - 23 mg/dL   Creatinine, Ser 0.57  0.50 - 1.10 mg/dL   Calcium 9.7  8.4 - 10.5 mg/dL   Total Protein 7.4  6.0 - 8.3 g/dL   Albumin 3.9  3.5 - 5.2 g/dL   AST 22  0 - 37 U/L   ALT 15  0 - 35 U/L   Alkaline Phosphatase 74  39 - 117 U/L   Total Bilirubin 0.6  0.3 - 1.2 mg/dL   GFR calc non Af Amer 85 (*) >90 mL/min   GFR calc Af Amer >90  >90 mL/min   Comment: (NOTE)     The eGFR has been calculated using the CKD EPI equation.     This calculation has not been validated in all clinical situations.     eGFR's persistently <90 mL/min signify possible Chronic Kidney     Disease.  APTT     Status: None   Collection Time    12/25/13  5:21 PM      Result Value Ref Range   aPTT 28  24 - 37 seconds  PROTIME-INR     Status: None   Collection Time    12/25/13  5:21 PM      Result Value Ref Range   Prothrombin Time 13.6  11.6 - 15.2 seconds   INR 1.06  0.00 - 1.49  SAMPLE TO BLOOD BANK     Status: None   Collection Time    12/25/13  5:22 PM      Result Value Ref Range   Blood Bank Specimen BBHLD     Sample Expiration 12/26/2013    URINALYSIS, ROUTINE W REFLEX MICROSCOPIC     Status: None   Collection Time    12/25/13  5:48 PM      Result Value Ref Range   Color, Urine YELLOW  YELLOW   APPearance CLEAR  CLEAR   Specific Gravity, Urine 1.020  1.005 - 1.030   pH 6.0  5.0 - 8.0   Glucose, UA NEGATIVE  NEGATIVE mg/dL   Hgb urine dipstick NEGATIVE  NEGATIVE    Bilirubin Urine NEGATIVE  NEGATIVE   Ketones, ur NEGATIVE  NEGATIVE mg/dL   Protein, ur NEGATIVE  NEGATIVE mg/dL   Urobilinogen, UA 0.2  0.0 - 1.0 mg/dL   Nitrite NEGATIVE  NEGATIVE   Leukocytes, UA NEGATIVE  NEGATIVE   Comment: MICROSCOPIC NOT DONE ON URINES WITH NEGATIVE PROTEIN, BLOOD, LEUKOCYTES, NITRITE, OR GLUCOSE <1000 mg/dL.    Radiology Reports: Dg Hip Complete Left  12/25/2013   CLINICAL DATA:  Fall, left hip pain  EXAM: LEFT HIP - COMPLETE 2+ VIEW  COMPARISON:  None.  FINDINGS: Impacted transcervical left femoral neck fracture. There is marked foreshortening of the femoral neck. The femoral head component remains located within the acetabulum. The bony pelvis appears intact. The visualized proximal right femur is unremarkable. Degenerative disc disease and levoconvex scoliosis incompletely seen in the low lumbar spine. Normal bony mineralization for age. No discrete osseous lesion.  IMPRESSION: Impacted transcervical left femoral neck fracture with marked foreshortening of the femoral neck.   Electronically Signed   By: Jacqulynn Cadet M.D.   On: 12/25/2013 17:48   Dg Chest Portable 1 View  12/25/2013   CLINICAL DATA:  Fall, left hip pain, preoperative chest x-ray  EXAM: PORTABLE CHEST - 1 VIEW  COMPARISON:  Prior chest x-ray 12/27/2011  FINDINGS: Stable cardiac and mediastinal contours.  Persistent mild cardiomegaly. Atherosclerotic and tortuous thoracic aorta. Central bronchitic changes and interstitial prominence are similar compared to prior. No over pulmonary edema, pleural effusion, pneumothorax or suspicious nodular mass. No focal airspace consolidation. No acute osseous abnormality.  IMPRESSION: No active disease.  Chronic bronchitic changes and interstitial prominence.  Tortuous and atherosclerotic thoracic aorta.  Borderline cardiomegaly.   Electronically Signed   By: Jacqulynn Cadet M.D.   On: 12/25/2013 17:46    Electrocardiogram: Sinus rhythm at 78 beats per minute. Left  axis deviation. T inversion in aVL. Intervals appear to be normal. No Q waves. No concerning ST or T-wave changes. Somewhat similar to previous EKG.  Problem List  Principal Problem:   Fracture of femoral neck, left, closed Active Problems:   Essential hypertension   Assessment: This is 80 year old, Caucasian female, who had a mechanical fall, resulting in fracture of the left hip. Her medical history includes hypertension. She has nonspecific changes on the EKG, which are old. No known history of heart disease.  Plan: #1 left hip fracture: She'll be admitted to the hospital. Orthopedics has been consulted by ED physician. They were most likely operate on her tomorrow morning. Pain medications will be provided. Hip fracture protocol will be initiated.  #2 preoperative evaluation: Her Lyndel Safe Perioperative Cardiac Risk is about 0.21%. She is a fairly active individual with good functional capacity. EKG does not show any acute changes. She may proceed to surgery at this time without further cardiac testing. Continue her beta blocker. Will recommend incentive spirometry postoperatively. Her tobacco use does put her at risk for requiring prolonged mechanical ventilation.  #3 hypertension: Continue with her antihypertensive regimen. Give holding parameters. Hydralazine as needed. Continue beta blocker as well.  #4 history of hyperlipidemia: Continue with statin.  #5 tobacco abuse: Nicotine patch will be utilized. Counseling will need to be provided.   DVT Prophylaxis: SCD's for now. Definitive prophylaxis will be instituted after her surgery Code Status: Full code Family Communication: Discussed with the patient, her sister and niece  Disposition Plan: Admit to the hospital   Further management decisions will depend on results of further testing and patient's response to treatment.   Bonnielee Haff  [facility] Pager [phone]  If 7PM-7AM, please contact  night-coverage [email or web address] Password [facility]  12/25/2013, 7:34 PM

## 2013-12-25 NOTE — Consult Note (Signed)
Reason for Consult:Fracture of the left hip Referring Physician: ER  Whitney [name] is an 80 y.o. female.  HPI: She fell at home today.  She was coming out the back door carrying pies to take to her church.  She lost her footing and fell hurting her left hip.  She could not stand.  She managed to crawl back in house and get help.  X-rays show a displaced femoral neck fracture on the left.  She has no other injury.  She has never had any surgery and never been a patient in a hospital.  I have discussed with her, her niece and her minister the findings and need for surgery on the left hip.  She will need a bipolar hip replacement.  I have gone over the risks and imponderables of the procedure including infection, possible blood transfusion, possible nerve injury, need for physical therapy and skilled nursing home placement, dislocation possibility and need for abduction pillow and anesthesia risks.  I have suggested consideration of spinal anesthesia as she is a current smoker.  I have told her she will need nicotine patches.  She asked appropriate questions and appears to understand and agrees to the procedure. I will do it in the morning.  Past Medical History  Diagnosis Date  . Hypertension   . Hyperlipidemia   . Tobacco abuse     History reviewed. No pertinent past surgical history.  Family History  Problem Relation Age of Onset  . Diabetes Sister     Social History:  reports that she has been smoking Cigarettes.  She has a 50 pack-year smoking history. She does not have any smokeless tobacco history on file. She reports that she does not drink alcohol or use illicit drugs.  Allergies:  Allergies  Allergen Reactions  . Prednisone Other (See Comments)    Increased Blood pressure levels    Medications: I have reviewed the patient's current medications.  Results for orders placed during the hospital encounter of 12/25/13 (from the past 48 hour(s))  CBC WITH DIFFERENTIAL      Status: Abnormal   Collection Time    12/25/13  5:21 PM      Result Value Ref Range   WBC 10.3  4.0 - 10.5 K/uL   RBC 4.31  3.87 - 5.11 MIL/uL   Hemoglobin 13.9  12.0 - 15.0 g/dL   HCT 39.1  36.0 - 46.0 %   MCV 90.7  78.0 - 100.0 fL   MCH 32.3  26.0 - 34.0 pg   MCHC 35.5  30.0 - 36.0 g/dL   RDW 12.4  11.5 - 15.5 %   Platelets 235  150 - 400 K/uL   Neutrophils Relative % 80 (*) 43 - 77 %   Neutro Abs 8.2 (*) 1.7 - 7.7 K/uL   Lymphocytes Relative 11 (*) 12 - 46 %   Lymphs Abs 1.2  0.7 - 4.0 K/uL   Monocytes Relative 7  3 - 12 %   Monocytes Absolute 0.8  0.1 - 1.0 K/uL   Eosinophils Relative 1  0 - 5 %   Eosinophils Absolute 0.1  0.0 - 0.7 K/uL   Basophils Relative 0  0 - 1 %   Basophils Absolute 0.0  0.0 - 0.1 K/uL   WBC Morphology ATYPICAL LYMPHOCYTES    COMPREHENSIVE METABOLIC PANEL     Status: Abnormal   Collection Time    12/25/13  5:21 PM      Result Value Ref Range  Sodium 136 (*) 137 - 147 mEq/L   Potassium 3.4 (*) 3.7 - 5.3 mEq/L   Chloride 93 (*) 96 - 112 mEq/L   CO2 31  19 - 32 mEq/L   Glucose, Bld 115 (*) 70 - 99 mg/dL   BUN 16  6 - 23 mg/dL   Creatinine, Ser 0.57  0.50 - 1.10 mg/dL   Calcium 9.7  8.4 - 10.5 mg/dL   Total Protein 7.4  6.0 - 8.3 g/dL   Albumin 3.9  3.5 - 5.2 g/dL   AST 22  0 - 37 U/L   ALT 15  0 - 35 U/L   Alkaline Phosphatase 74  39 - 117 U/L   Total Bilirubin 0.6  0.3 - 1.2 mg/dL   GFR calc non Af Amer 85 (*) >90 mL/min   GFR calc Af Amer >90  >90 mL/min   Comment: (NOTE)     The eGFR has been calculated using the CKD EPI equation.     This calculation has not been validated in all clinical situations.     eGFR's persistently <90 mL/min signify possible Chronic Kidney     Disease.  APTT     Status: None   Collection Time    12/25/13  5:21 PM      Result Value Ref Range   aPTT 28  24 - 37 seconds  PROTIME-INR     Status: None   Collection Time    12/25/13  5:21 PM      Result Value Ref Range   Prothrombin Time 13.6  11.6 - 15.2  seconds   INR 1.06  0.00 - 1.49  SAMPLE TO BLOOD BANK     Status: None   Collection Time    12/25/13  5:22 PM      Result Value Ref Range   Blood Bank Specimen BBHLD     Sample Expiration 12/26/2013    URINALYSIS, ROUTINE W REFLEX MICROSCOPIC     Status: None   Collection Time    12/25/13  5:48 PM      Result Value Ref Range   Color, Urine YELLOW  YELLOW   APPearance CLEAR  CLEAR   Specific Gravity, Urine 1.020  1.005 - 1.030   pH 6.0  5.0 - 8.0   Glucose, UA NEGATIVE  NEGATIVE mg/dL   Hgb urine dipstick NEGATIVE  NEGATIVE   Bilirubin Urine NEGATIVE  NEGATIVE   Ketones, ur NEGATIVE  NEGATIVE mg/dL   Protein, ur NEGATIVE  NEGATIVE mg/dL   Urobilinogen, UA 0.2  0.0 - 1.0 mg/dL   Nitrite NEGATIVE  NEGATIVE   Leukocytes, UA NEGATIVE  NEGATIVE   Comment: MICROSCOPIC NOT DONE ON URINES WITH NEGATIVE PROTEIN, BLOOD, LEUKOCYTES, NITRITE, OR GLUCOSE <1000 mg/dL.    Dg Hip Complete Left  12/25/2013   CLINICAL DATA:  Fall, left hip pain  EXAM: LEFT HIP - COMPLETE 2+ VIEW  COMPARISON:  None.  FINDINGS: Impacted transcervical left femoral neck fracture. There is marked foreshortening of the femoral neck. The femoral head component remains located within the acetabulum. The bony pelvis appears intact. The visualized proximal right femur is unremarkable. Degenerative disc disease and levoconvex scoliosis incompletely seen in the low lumbar spine. Normal bony mineralization for age. No discrete osseous lesion.  IMPRESSION: Impacted transcervical left femoral neck fracture with marked foreshortening of the femoral neck.   Electronically Signed   By: Jacqulynn Cadet M.D.   On: 12/25/2013 17:48   Dg Chest Portable 1 View  12/25/2013   CLINICAL DATA:  Fall, left hip pain, preoperative chest x-ray  EXAM: PORTABLE CHEST - 1 VIEW  COMPARISON:  Prior chest x-ray 12/27/2011  FINDINGS: Stable cardiac and mediastinal contours. Persistent mild cardiomegaly. Atherosclerotic and tortuous thoracic aorta. Central  bronchitic changes and interstitial prominence are similar compared to prior. No over pulmonary edema, pleural effusion, pneumothorax or suspicious nodular mass. No focal airspace consolidation. No acute osseous abnormality.  IMPRESSION: No active disease.  Chronic bronchitic changes and interstitial prominence.  Tortuous and atherosclerotic thoracic aorta.  Borderline cardiomegaly.   Electronically Signed   By: Jacqulynn Cadet M.D.   On: 12/25/2013 17:46    Review of Systems  Respiratory:       Current smoker  Cardiovascular:       History of hypertension, history of hyperlipidemia  Musculoskeletal: Positive for falls (today at home back porch area.  Hurt left hip.).   Blood pressure 135/62, pulse 79, temperature 98.2 F (36.8 C), temperature source Oral, resp. rate 20, height 5' 4" (1.626 m), weight 55.339 kg (122 lb), SpO2 93.00%. Physical Exam  Constitutional: She is oriented to person, place, and time. She appears well-developed and well-nourished.  HENT:  Head: Normocephalic and atraumatic.  Eyes: Conjunctivae and EOM are normal. Pupils are equal, round, and reactive to light.  Neck: Normal range of motion. Neck supple.  Cardiovascular: Normal rate, regular rhythm and intact distal pulses.   Respiratory: Effort normal.  GI: Soft.  Musculoskeletal: She exhibits tenderness (Pain with motion of the left hip, shortened left lower extremity but not excessively rotated.  NV is intact.).       Left hip: She exhibits decreased range of motion, tenderness and bony tenderness.       Legs: Neurological: She is alert and oriented to person, place, and time. She has normal reflexes.  Skin: Skin is warm and dry.  Psychiatric: She has a normal mood and affect. Her behavior is normal. Judgment and thought content normal.    Assessment/Plan: Displaced femoral neck fracture of the left hip.  For bipolar hip in am.  Sanjuana Kava 12/25/2013, 8:31 PM

## 2013-12-26 ENCOUNTER — Inpatient Hospital Stay (HOSPITAL_COMMUNITY): Payer: Medicare Other | Admitting: Anesthesiology

## 2013-12-26 ENCOUNTER — Inpatient Hospital Stay (HOSPITAL_COMMUNITY): Payer: Medicare Other

## 2013-12-26 ENCOUNTER — Encounter (HOSPITAL_COMMUNITY): Payer: Medicare Other | Admitting: Anesthesiology

## 2013-12-26 ENCOUNTER — Encounter (HOSPITAL_COMMUNITY)
Admission: EM | Disposition: A | Discharge status: Skilled Nursing Facility | Payer: Self-pay | Source: Home / Self Care | Attending: Internal Medicine

## 2013-12-26 DIAGNOSIS — E785 Hyperlipidemia, unspecified: Secondary | ICD-10-CM

## 2013-12-26 HISTORY — PX: HIP ARTHROPLASTY: SHX981

## 2013-12-26 LAB — BASIC METABOLIC PANEL
BUN: 14 mg/dL (ref 6–23)
CHLORIDE: 95 meq/L — AB (ref 96–112)
CO2: 32 meq/L (ref 19–32)
Calcium: 8.8 mg/dL (ref 8.4–10.5)
Creatinine, Ser: 0.61 mg/dL (ref 0.50–1.10)
GFR calc Af Amer: >90 mL/min (ref >90)
GFR calc non Af Amer: 83 mL/min — ABNORMAL LOW (ref >90)
GLUCOSE: 115 mg/dL — AB (ref 70–99)
Potassium: 4.2 mEq/L (ref 3.7–5.3)
Sodium: 135 mEq/L — ABNORMAL LOW (ref 137–147)

## 2013-12-26 LAB — SURGICAL PCR SCREEN
MRSA, PCR: NEGATIVE
Staphylococcus aureus: NEGATIVE

## 2013-12-26 LAB — CBC
HEMATOCRIT: 38.2 % (ref 36.0–46.0)
Hemoglobin: 12.7 g/dL (ref 12.0–15.0)
MCH: 30.8 pg (ref 26.0–34.0)
MCHC: 33.2 g/dL (ref 30.0–36.0)
MCV: 92.5 fL (ref 78.0–100.0)
Platelets: 236 10*3/uL (ref 150–400)
RBC: 4.13 MIL/uL (ref 3.87–5.11)
RDW: 12.5 % (ref 11.5–15.5)
WBC: 7.6 10*3/uL (ref 4.0–10.5)

## 2013-12-26 SURGERY — HEMIARTHROPLASTY, HIP, DIRECT ANTERIOR APPROACH, FOR FRACTURE
Anesthesia: Spinal | Site: Hip | Laterality: Left

## 2013-12-26 MED ORDER — MIDAZOLAM HCL 2 MG/2ML IJ SOLN
INTRAMUSCULAR | Status: AC
  Filled 2013-12-26: qty 2

## 2013-12-26 MED ORDER — BUPIVACAINE IN DEXTROSE 0.75-8.25 % IT SOLN
INTRATHECAL | Status: AC
  Filled 2013-12-26: qty 2

## 2013-12-26 MED ORDER — FENTANYL CITRATE 0.05 MG/ML IJ SOLN
INTRAMUSCULAR | Status: DC | PRN
  Administered 2013-12-26 (×2): 25 ug via INTRAVENOUS
  Administered 2013-12-26: 25 ug via INTRATHECAL
  Administered 2013-12-26: 25 ug via INTRAVENOUS

## 2013-12-26 MED ORDER — SODIUM CHLORIDE 0.9 % IR SOLN
Status: DC | PRN
  Administered 2013-12-26: 1000 mL

## 2013-12-26 MED ORDER — LACTATED RINGERS IV SOLN
INTRAVENOUS | Status: DC | PRN
  Administered 2013-12-26 (×2): via INTRAVENOUS

## 2013-12-26 MED ORDER — ENOXAPARIN SODIUM 40 MG/0.4ML ~~LOC~~ SOLN
40.0000 mg | SUBCUTANEOUS | Status: DC
  Administered 2013-12-27 – 2013-12-30 (×4): 40 mg via SUBCUTANEOUS
  Filled 2013-12-26 (×4): qty 0.4

## 2013-12-26 MED ORDER — PROPOFOL INFUSION 10 MG/ML OPTIME
INTRAVENOUS | Status: DC | PRN
  Administered 2013-12-26: 20 ug/kg/min via INTRAVENOUS

## 2013-12-26 MED ORDER — PHENYLEPHRINE HCL 10 MG/ML IJ SOLN
INTRAMUSCULAR | Status: AC
  Filled 2013-12-26: qty 1

## 2013-12-26 MED ORDER — EPHEDRINE SULFATE 50 MG/ML IJ SOLN
INTRAMUSCULAR | Status: AC
  Filled 2013-12-26: qty 1

## 2013-12-26 MED ORDER — MAGNESIUM HYDROXIDE 400 MG/5ML PO SUSP
30.0000 mL | Freq: Every day | ORAL | Status: DC | PRN
  Administered 2013-12-28: 30 mL via ORAL
  Filled 2013-12-26: qty 30

## 2013-12-26 MED ORDER — PHENYLEPHRINE HCL 10 MG/ML IJ SOLN
INTRAMUSCULAR | Status: DC | PRN
  Administered 2013-12-26: 100 ug via INTRAVENOUS
  Administered 2013-12-26 (×4): 50 ug via INTRAVENOUS
  Administered 2013-12-26: 100 ug via INTRAVENOUS

## 2013-12-26 MED ORDER — PROPOFOL 10 MG/ML IV BOLUS
INTRAVENOUS | Status: DC | PRN
  Administered 2013-12-26: 10 mg via INTRAVENOUS

## 2013-12-26 MED ORDER — LIDOCAINE HCL (PF) 1 % IJ SOLN
INTRAMUSCULAR | Status: AC
  Filled 2013-12-26: qty 5

## 2013-12-26 MED ORDER — PROPOFOL 10 MG/ML IV EMUL
INTRAVENOUS | Status: AC
  Filled 2013-12-26: qty 20

## 2013-12-26 MED ORDER — ENOXAPARIN SODIUM 40 MG/0.4ML ~~LOC~~ SOLN: 40.0000 mg | SUBCUTANEOUS | Status: DC

## 2013-12-26 MED ORDER — MORPHINE SULFATE 2 MG/ML IJ SOLN
1.0000 mg | INTRAMUSCULAR | Status: DC | PRN
  Administered 2013-12-28: 1 mg via INTRAVENOUS
  Filled 2013-12-26: qty 1

## 2013-12-26 MED ORDER — FENTANYL CITRATE 0.05 MG/ML IJ SOLN
INTRAMUSCULAR | Status: AC
  Filled 2013-12-26: qty 2

## 2013-12-26 MED ORDER — CEFAZOLIN SODIUM-DEXTROSE 2-3 GM-% IV SOLR
INTRAVENOUS | Status: AC
  Filled 2013-12-26: qty 50

## 2013-12-26 MED ORDER — ALBUTEROL SULFATE (2.5 MG/3ML) 0.083% IN NEBU
2.5000 mg | INHALATION_SOLUTION | Freq: Four times a day (QID) | RESPIRATORY_TRACT | Status: AC
  Administered 2013-12-26 – 2013-12-29 (×12): 2.5 mg via RESPIRATORY_TRACT
  Filled 2013-12-26 (×12): qty 3

## 2013-12-26 MED ORDER — PROMETHAZINE HCL 25 MG/ML IJ SOLN: 12.5000 mg | INTRAMUSCULAR | Status: DC | PRN

## 2013-12-26 MED ORDER — SODIUM CHLORIDE BACTERIOSTATIC 0.9 % IJ SOLN
INTRAMUSCULAR | Status: AC
  Filled 2013-12-26: qty 20

## 2013-12-26 MED ORDER — SODIUM CHLORIDE BACTERIOSTATIC 0.9 % IJ SOLN
INTRAMUSCULAR | Status: AC
  Filled 2013-12-26: qty 10

## 2013-12-26 MED ORDER — EPHEDRINE SULFATE 50 MG/ML IJ SOLN
INTRAMUSCULAR | Status: DC | PRN
Start: 2013-12-26 — End: 2013-12-26
  Administered 2013-12-26: 5 mg via INTRAVENOUS
  Administered 2013-12-26: 10 mg via INTRAVENOUS
  Administered 2013-12-26: 5 mg via INTRAVENOUS

## 2013-12-26 MED ORDER — MIDAZOLAM HCL 5 MG/5ML IJ SOLN
INTRAMUSCULAR | Status: DC | PRN
  Administered 2013-12-26 (×2): 1 mg via INTRAVENOUS

## 2013-12-26 MED ORDER — HYDROGEN PEROXIDE 3 % EX SOLN
CUTANEOUS | Status: DC | PRN
  Administered 2013-12-26: 1 via TOPICAL

## 2013-12-26 SURGICAL SUPPLY — 58 items
BAG HAMPER (MISCELLANEOUS) ×3 IMPLANT
BLADE SAGITTAL 25.0X1.27X90 (BLADE) ×2 IMPLANT
BLADE SAGITTAL 25.0X1.27X90MM (BLADE) ×1
BLADE SURG SZ20 CARB STEEL (BLADE) ×3 IMPLANT
CHLORAPREP W/TINT 26ML (MISCELLANEOUS) ×6 IMPLANT
CLOTH BEACON ORANGE TIMEOUT ST (SAFETY) ×3 IMPLANT
COVER LIGHT HANDLE STERIS (MISCELLANEOUS) ×6 IMPLANT
COVER MAYO STAND XLG (DRAPE) ×3 IMPLANT
COVER X RAY CASSETTE (MISCELLANEOUS) ×3 IMPLANT
DRAIN PENROSE 18X.75 LTX STRL (MISCELLANEOUS) ×3 IMPLANT
DRAPE ORTHO 2.5IN SPLIT 77X108 (DRAPES) ×2 IMPLANT
DRAPE ORTHO SPLIT 77X108 STRL (DRAPES) ×4
DRAPE PROXIMA HALF (DRAPES) ×3 IMPLANT
DRSG XEROFORM 1X8 (GAUZE/BANDAGES/DRESSINGS) ×3 IMPLANT
EVACUATOR 1/8 PVC DRAIN (DRAIN) ×3 IMPLANT
EVACUATOR 3/16  PVC DRAIN (DRAIN)
EVACUATOR 3/16 PVC DRAIN (DRAIN) IMPLANT
FACESHIELD LNG OPTICON STERILE (SAFETY) ×3 IMPLANT
FORMALIN 10 PREFIL 480ML (MISCELLANEOUS) ×3 IMPLANT
GAUZE XEROFORM 5X9 LF (GAUZE/BANDAGES/DRESSINGS) ×3 IMPLANT
GLOVE BIO SURGEON STRL SZ8 (GLOVE) ×3 IMPLANT
GLOVE BIO SURGEON STRL SZ8.5 (GLOVE) ×3 IMPLANT
GLOVE ECLIPSE 6.5 STRL STRAW (GLOVE) IMPLANT
GLOVE ECLIPSE 7.5 STRL STRAW (GLOVE) ×3 IMPLANT
GLOVE EXAM NITRILE MD LF STRL (GLOVE) ×3 IMPLANT
GLOVE INDICATOR 7.0 STRL GRN (GLOVE) ×6 IMPLANT
GLOVE INDICATOR 7.5 STRL GRN (GLOVE) ×6 IMPLANT
GOWN STRL REUS W/TWL LRG LVL3 (GOWN DISPOSABLE) ×9 IMPLANT
GOWN STRL REUS W/TWL XL LVL3 (GOWN DISPOSABLE) ×3 IMPLANT
HANDPIECE INTERPULSE COAX TIP (DISPOSABLE)
HIP BIPOLAR TANDEM MED DMND ×3 IMPLANT
INST SET MAJOR BONE (KITS) ×3 IMPLANT
IV NS IRRIG 3000ML ARTHROMATIC (IV SOLUTION) IMPLANT
KIT BLADEGUARD II DBL (SET/KITS/TRAYS/PACK) ×3 IMPLANT
KIT ROOM TURNOVER APOR (KITS) ×3 IMPLANT
MANIFOLD NEPTUNE II (INSTRUMENTS) ×3 IMPLANT
MARKER SKIN DUAL TIP RULER LAB (MISCELLANEOUS) ×3 IMPLANT
NS IRRIG 1000ML POUR BTL (IV SOLUTION) ×3 IMPLANT
PACK TOTAL JOINT (CUSTOM PROCEDURE TRAY) ×3 IMPLANT
PAD ABD 5X9 TENDERSORB (GAUZE/BANDAGES/DRESSINGS) ×6 IMPLANT
PAD ARMBOARD 7.5X6 YLW CONV (MISCELLANEOUS) ×3 IMPLANT
PILLOW HIP ABDUCTION LRG (ORTHOPEDIC SUPPLIES) IMPLANT
PILLOW HIP ABDUCTION MED (ORTHOPEDIC SUPPLIES) ×3 IMPLANT
SET BASIN LINEN APH (SET/KITS/TRAYS/PACK) ×3 IMPLANT
SET HNDPC FAN SPRY TIP SCT (DISPOSABLE) IMPLANT
SPONGE DRAIN TRACH 4X4 STRL 2S (GAUZE/BANDAGES/DRESSINGS) ×3 IMPLANT
SPONGE GAUZE 4X4 12PLY (GAUZE/BANDAGES/DRESSINGS) ×3 IMPLANT
STAPLER VISISTAT 35W (STAPLE) ×3 IMPLANT
SUT BRALON NAB BRD #1 30IN (SUTURE) ×9 IMPLANT
SUT CHROMIC 1 CTX 36 (SUTURE) ×12 IMPLANT
SUT PLAIN 2 0 XLH (SUTURE) ×9 IMPLANT
SUT SILK 0 FSL (SUTURE) ×3 IMPLANT
SWAB CULTURE LIQ STUART DBL (MISCELLANEOUS) ×3 IMPLANT
TAPE MEDIFIX FOAM 3 (GAUZE/BANDAGES/DRESSINGS) ×3 IMPLANT
TOWER CARTRIDGE SMART MIX (DISPOSABLE) IMPLANT
TRAY FOLEY CATH 16FR SILVER (SET/KITS/TRAYS/PACK) IMPLANT
WATER STERILE IRR 1000ML POUR (IV SOLUTION) ×3 IMPLANT
YANKAUER SUCT 12FT TUBE ARGYLE (SUCTIONS) ×3 IMPLANT

## 2013-12-26 NOTE — Brief Op Note (Signed)
12/25/2013 - 12/26/2013  9:48 AM  PATIENT:  Whitney [name]  80 y.o. female  PRE-OPERATIVE DIAGNOSIS:  Left Hip Fracture Femoral neck  POST-OPERATIVE DIAGNOSIS:  Left Femoral Neck Fracture  PROCEDURE:  Procedure(s): ARTHROPLASTY BIPOLAR HIP (Left)  SURGEON:  Surgeon(s) and Role:    * Darreld McleanWayne Tobias Avitabile, MD - Primary  PHYSICIAN ASSISTANT:   ASSISTANTS: C. Annabell HowellsWrenn, RN   ANESTHESIA:   spinal  EBL:  Total I/O In: 1000 [I.V.:1000] Out: 200 [Urine:100; Blood:100]  BLOOD ADMINISTERED:none  DRAINS: (small) Hemovact drain(s) in the left hip area with  Suction Open   LOCAL MEDICATIONS USED:  NONE  SPECIMEN:  Source of Specimen:  left femoral head  DISPOSITION OF SPECIMEN:  PATHOLOGY  COUNTS:  YES  TOURNIQUET:  * No tourniquets in log *  DICTATION: .Other Dictation: Dictation Number [id number]  PLAN OF CARE: Admit to inpatient   PATIENT DISPOSITION:  PACU - hemodynamically stable.   Delay start of Pharmacological VTE agent (>24hrs) due to surgical blood loss or risk of bleeding: no

## 2013-12-26 NOTE — Anesthesia Procedure Notes (Signed)
Spinal  Patient location during procedure: OR Start time: 12/26/2013 8:03 AM Staffing CRNA/Resident: Glynn OctaveANIEL, Saarah Dewing E Preanesthetic Checklist Completed: patient identified, site marked, surgical consent, pre-op evaluation, timeout performed, IV checked, risks and benefits discussed and monitors and equipment checked Spinal Block Patient position: left lateral decubitus Prep: Betadine Patient monitoring: heart rate, cardiac monitor, continuous pulse ox and blood pressure Approach: left paramedian Location: L3-4 Injection technique: single-shot Needle Needle type: Spinocan  Needle gauge: 22 G Needle length: 9 cm Assessment Sensory level: T8 Additional Notes  ATTEMPTS:1 TRAY ZO:[id number] TRAY EXPIRATION DATE:05/2015

## 2013-12-26 NOTE — Anesthesia Preprocedure Evaluation (Addendum)
Anesthesia Evaluation  Patient identified by MRN, date of birth, ID band Patient awake    Reviewed: Allergy & Precautions, H&P , NPO status , Patient's Chart, lab work & pertinent test results, reviewed documented beta blocker date and time   Airway Mallampati: II TM Distance: >3 FB     Dental  (+) Teeth Intact   Pulmonary Current Smoker,    + decreased breath sounds      Cardiovascular hypertension, Pt. on home beta blockers and Pt. on medications Rhythm:Regular Rate:Normal  Lopressor 4/24 at 10am, will continue at scheduled dose time   Neuro/Psych negative neurological ROS     GI/Hepatic negative GI ROS,   Endo/Other    Renal/GU      Musculoskeletal   Abdominal   Peds  Hematology   Anesthesia Other Findings   Reproductive/Obstetrics                         Anesthesia Physical Anesthesia Plan  ASA: III and emergent  Anesthesia Plan: Spinal   Post-op Pain Management:    Induction:   Airway Management Planned: Simple Face Mask  Additional Equipment:   Intra-op Plan:   Post-operative Plan:   Informed Consent: I have reviewed the patients History and Physical, chart, labs and discussed the procedure including the risks, benefits and alternatives for the proposed anesthesia with the patient or authorized representative who has indicated his/her understanding and acceptance.     Plan Discussed with:   Anesthesia Plan Comments:         Anesthesia Quick Evaluation

## 2013-12-26 NOTE — Op Note (Signed)
NAM[place]:  Whitney [name], Whitney [name]           ACCOUNT NO.:  [id number]  MEDICAL RECORD NO.:  [id number]  LOCATION:  A301                          FACILITY:  [facility]  PHYSICIAN:  J. Darreld McleanWayne Salem Mastrogiovanni, M.D. DATE OF BIRTH:  01/20/1930  DATE OF PROCEDURE:  12/26/2013 DATE OF DISCHARGE:                              OPERATIVE REPORT   PREOPERATIVE DIAGNOSIS:  Displaced femoral neck fracture on the left.  POSTOPERATIVE DIAGNOSIS:  Displaced femoral neck fracture on the left.  PROCEDURE:  Bipolar hip prostheses using a Smith and Nephew, size 11 stem, +4 neck, 47 mm head.  ANESTHESIA:  Spinal.  SURGEON:  J. Darreld McleanWayne Mathis Cashman, M.D.  ASSISTANT:  Cecile Sheererynthia Wrenn, RN.  DRAINS:  One Hemovac drain used.  ESTIMATED BLOOD LOSS:  100-125 mL, none replaced.  INDICATIONS:  The patient fell yesterday at her home and sustained a fracture of her left hip.  I saw her in the emergency room, discussed the findings and recommend a bipolar prosthesis.  Risks and imponderables were discussed in detail with the patient.  She asked appropriate questions and agreed to the procedure.  DESCRIPTION OF THE PROCEDURE:  The patient was seen in the holding area and identified the left hip as the correct surgical site.  I put a mark on the left hip.  The patient brought to the operating room, given spinal anesthesia.  She was then placed right side down, left side up positioning, held in place with supports.  She was then prepped and draped in the usual manner.  We had a time-out identifying the patient as [name] and doing on the left bipolar hip.  When the room knew each other, all instrumentation properly positioned and working.  Having been prepped and draped, an incision was made in the posterior part of the hip.  Sciatic nerve was identified and protected with Penrose drain throughout the remainder of the case.  Short external rotator was identified, tagged, and cut.  The capsule was opened and hematoma expressed.   Culture was taken of this fluid.  Femoral head was exposed and using a corkscrew, the femoral head was removed.  It measured 47 mm.  Femoral neck was not evaluated.  A box osteotome was used.  Canal was entered first with a small curette and then with a sizing device.  Canal was then reamed up to a size 12.  Femoral neck was cut and was rasped up to a size 11.  11 fit best.  Trial reduction was carried out with #11+ 4, 47 head fit very nicely with good positioning and stable.  These temporary prosthesis removed and an 11 prosthesis was inserted with the +4 neck and 47 head bipolar type.  Reduction was carried out.  X-rays were taken.  These looked good on the single view. Hemovac drain was placed, sewn in with 2-0 silk suture.  Short external rotator was reapproximated through previously applied tags.  The fascial layer reapproximated using figure-of-eight #1 Surgilon suture.  Subcutaneous tissues closed in layers of 2-0 plain.  Skin reapproximated with skin staples.  Sterile dressing applied.  Abduction pillow applied.  The patient tolerated the procedure well, will go to recovery in good condition.  She is admitted of  course.          ______________________________ J. Darreld McleanWayne Daquisha Clermont, M.D.     JWK/MEDQ  D:  12/26/2013  T:  12/26/2013  Job:  [id number]

## 2013-12-26 NOTE — Progress Notes (Signed)
Lovenox ordered.  Per Dr. Kerry HoughMemon verified with Dr. Hilda LiasKeeling.  Dr. Hilda LiasKeeling stated patient may start the Lovenox 40 mg daily tomorrow.

## 2013-12-26 NOTE — Progress Notes (Signed)
[facility] PROGRESS NOTE  Whitney PatientRebecca S [name] WUJ:[id number] DOB: 12-22-1929 DOA: 12/25/2013 PCP: Colette RibasGOLDING, JOHN CABOT, MD  Assessment/Plan: 1. Left hip fracture. Status post operative repair. [name] will need to go under physical therapy evaluation. 2. Hypertension. Continue current antihypertensive regimen. Blood pressure stable. 3. History of hyperlipidemia. Continue statin. 4. Tobacco abuse. Counseled on the importance of tobacco cessation. 5. DVT prophylaxis: Lovenox  Code Status: full code Family Communication: discussed with [name] and sister at the bedside Disposition Plan: probable SNF discharge   Consultants:  Orthopedics, Dr. Hilda LiasKeeling  Procedures: Bipolar hip prostheses using a Smith and Nephew, size 11 stem, +4 neck, 47 mm head   Antibiotics:  HPI/Subjective: Complaining of pain in her left leg.  Feels nauseous  Objective: Filed Vitals:   12/26/13 1511  BP: 105/65  Pulse: 84  Temp: 98.7 F (37.1 C)  Resp: 20    Intake/Output Summary (Last 24 hours) at 12/26/13 1709 Last data filed at 12/26/13 1527  Gross per 24 hour  Intake 3030.83 ml  Output    725 ml  Net 2305.83 ml   Filed Weights   12/25/13 1641 12/25/13 2102  Weight: 55.339 kg (122 lb) 54.8 kg (120 lb 13 oz)    Exam:   General:  NAD  Cardiovascular: s1, s2, rrr  Respiratory: cta b  Abdomen: soft, nt, nd, bs+  Musculoskeletal:  No edema b/l   Data Reviewed: Basic Metabolic Panel:  Recent Labs Lab 12/25/13 1721 12/26/13 0527  NA 136* 135*  K 3.4* 4.2  CL 93* 95*  CO2 31 32  GLUCOSE 115* 115*  BUN 16 14  CREATININE 0.57 0.61  CALCIUM 9.7 8.8   Liver Function Tests:  Recent Labs Lab 12/25/13 1721  AST 22  ALT 15  ALKPHOS 74  BILITOT 0.6  PROT 7.4  ALBUMIN 3.9   No results found for this basename: LIPASE, AMYLASE,  in the last 168 hours No results found for this basename: AMMONIA,  in the last 168 hours CBC:  Recent Labs Lab 12/25/13 1721  12/26/13 0527  WBC 10.3 7.6  NEUTROABS 8.2*  --   HGB 13.9 12.7  HCT 39.1 38.2  MCV 90.7 92.5  PLT 235 236   Cardiac Enzymes: No results found for this basename: CKTOTAL, CKMB, CKMBINDEX, TROPONINI,  in the last 168 hours BNP (last 3 results) No results found for this basename: PROBNP,  in the last 8760 hours CBG: No results found for this basename: GLUCAP,  in the last 168 hours  Recent Results (from the past 240 hour(s))  SURGICAL PCR SCREEN     Status: None   Collection Time    12/26/13  1:07 AM      Result Value Ref Range Status   MRSA, PCR NEGATIVE  NEGATIVE Final   Staphylococcus aureus NEGATIVE  NEGATIVE Final   Comment:            The Xpert SA Assay (FDA     approved for NASAL specimens     in patients over 78 years of age),     is one component of     a comprehensive surveillance     program.  Test performance has     been validated by [facility] for patients greater     than or equal to 10265 year old.     It is not intended     to diagnose infection nor to     guide or monitor treatment.  Studies: Dg Hip Complete Left  12/25/2013   CLINICAL DATA:  Fall, left hip pain  EXAM: LEFT HIP - COMPLETE 2+ VIEW  COMPARISON:  None.  FINDINGS: Impacted transcervical left femoral neck fracture. There is marked foreshortening of the femoral neck. The femoral head component remains located within the acetabulum. The bony pelvis appears intact. The visualized proximal right femur is unremarkable. Degenerative disc disease and levoconvex scoliosis incompletely seen in the low lumbar spine. Normal bony mineralization for age. No discrete osseous lesion.  IMPRESSION: Impacted transcervical left femoral neck fracture with marked foreshortening of the femoral neck.   Electronically Signed   By: Malachy MoanHeath  McCullough M.D.   On: 12/25/2013 17:48   Dg Chest Portable 1 View  12/25/2013   CLINICAL DATA:  Fall, left hip pain, preoperative chest x-ray  EXAM: PORTABLE CHEST - 1 VIEW   COMPARISON:  Prior chest x-ray 12/27/2011  FINDINGS: Stable cardiac and mediastinal contours. Persistent mild cardiomegaly. Atherosclerotic and tortuous thoracic aorta. Central bronchitic changes and interstitial prominence are similar compared to prior. No over pulmonary edema, pleural effusion, pneumothorax or suspicious nodular mass. No focal airspace consolidation. No acute osseous abnormality.  IMPRESSION: No active disease.  Chronic bronchitic changes and interstitial prominence.  Tortuous and atherosclerotic thoracic aorta.  Borderline cardiomegaly.   Electronically Signed   By: Malachy MoanHeath  McCullough M.D.   On: 12/25/2013 17:46   Dg Hip Portable 1 View Left  12/26/2013   CLINICAL DATA:  Postop hip radiograph  EXAM: PORTABLE LEFT HIP - 1 VIEW  COMPARISON:  DG HIP COMPLETE*L* dated 12/25/2013  FINDINGS: Single portable AP projection of radiographic image of the left hip demonstrates the sequela of left bipolar hip replacement. Alignment appears anatomic on the solitary AP projection radiograph. No definite fracture. There is expected subcutaneous emphysema about the operative site. No definite radiopaque foreign body.  IMPRESSION: Post bipolar left hip replacement without evidence of complication.   Electronically Signed   By: Simonne ComeJohn  Watts M.D.   On: 12/26/2013 09:36    Scheduled Meds: . albuterol  2.5 mg Nebulization Q6H  . aspirin EC  81 mg Oral q morning - 10a  . atorvastatin  10 mg Oral QHS  . cloNIDine  0.1 mg Oral Q supper  . cloNIDine  0.2 mg Oral Daily  . doxazosin  2 mg Oral QHS  . irbesartan  300 mg Oral Daily  . metoprolol succinate  100 mg Oral q morning - 10a  . nicotine  14 mg Transdermal Daily   Continuous Infusions: . sodium chloride 50 mL/hr at 12/26/13 1206    Principal Problem:   Fracture of femoral neck, left, closed Active Problems:   Essential hypertension   Tobacco abuse    Time spent: 25mins    [facility] InstituteJehanzeb Memon  [facility] Pager [phone]. If 7PM-7AM,  please contact night-coverage at [email or web address], password [email or web address] 12/26/2013, 5:09 PM  LOS: 1 day

## 2013-12-26 NOTE — Transfer of Care (Signed)
Immediate Anesthesia Transfer of Care Note  Patient: Whitney [name]  Procedure(s) Performed: Procedure(s): ARTHROPLASTY BIPOLAR HIP LEFT (Left)  Patient Location: PACU  Anesthesia Type:Spinal  Level of Consciousness: awake, alert  and oriented  Airway & Oxygen Therapy: Patient Spontanous Breathing  Post-op Assessment: Report given to PACU RN  Post vital signs: Reviewed  Complications: No apparent anesthesia complications

## 2013-12-26 NOTE — Progress Notes (Signed)
Nutrition Brief Note  Patient identified on the Malnutrition Screening Tool (MST) Report  Wt Readings from Last 15 Encounters:  12/25/13 120 lb 13 oz (54.8 kg)  12/25/13 120 lb 13 oz (54.8 kg)  08/05/13 123 lb (55.792 kg)   Spoke with pt sister who reports good appetite PTA and now. Pt has eaten a few bites of her lunch during RD visit, but drowsy due to recent sx. She denies any weight loss. Pt sister reports pt is in extremely good health and is very independent.   Body mass index is 20.73 kg/(m^2). Patient meets criteria for normal weight based on current BMI.   Current diet order is Heart Healthy, patient is consuming approximately n/a% of meals at this time. Labs and medications reviewed.   No nutrition interventions warranted at this time. If nutrition issues arise, please consult RD.   Allah Reason A. Mayford KnifeWilliams, RD, LDN Pager: [phone]

## 2013-12-26 NOTE — Anesthesia Postprocedure Evaluation (Signed)
  Anesthesia Post-op Note  Patient: Whitney [name] PatientRebecca S Nunnery  Procedure(s) Performed: Procedure(s): ARTHROPLASTY BIPOLAR HIP LEFT (Left)  Patient Location: PACU  Anesthesia Type:Spinal  Level of Consciousness: awake, alert  and oriented  Airway and Oxygen Therapy: Patient Spontanous Breathing and Patient connected to nasal cannula oxygen  Post-op Pain: none  Post-op Assessment: Post-op Vital signs reviewed, Patient's Cardiovascular Status Stable, Respiratory Function Stable, Patent Airway and No signs of Nausea or vomiting  Post-op Vital Signs: Reviewed and stable  Last Vitals:  Filed Vitals:   12/26/13 0706  BP: 166/72  Pulse: 97  Temp: 37.2 C  Resp: 18    Complications: No apparent anesthesia complications

## 2013-12-27 LAB — BASIC METABOLIC PANEL
BUN: 12 mg/dL (ref 6–23)
CHLORIDE: 94 meq/L — AB (ref 96–112)
CO2: 31 mEq/L (ref 19–32)
Calcium: 8.4 mg/dL (ref 8.4–10.5)
Creatinine, Ser: 0.55 mg/dL (ref 0.50–1.10)
GFR, EST NON AFRICAN AMERICAN: 86 mL/min — AB (ref >90)
Glucose, Bld: 113 mg/dL — ABNORMAL HIGH (ref 70–99)
POTASSIUM: 4 meq/L (ref 3.7–5.3)
SODIUM: 132 meq/L — AB (ref 137–147)

## 2013-12-27 LAB — CBC WITH DIFFERENTIAL/PLATELET
BASOS ABS: 0 10*3/uL (ref 0.0–0.1)
BASOS PCT: 0 % (ref 0–1)
EOS ABS: 0 10*3/uL (ref 0.0–0.7)
Eosinophils Relative: 0 % (ref 0–5)
HCT: 33.4 % — ABNORMAL LOW (ref 36.0–46.0)
Hemoglobin: 11.2 g/dL — ABNORMAL LOW (ref 12.0–15.0)
Lymphocytes Relative: 11 % — ABNORMAL LOW (ref 12–46)
Lymphs Abs: 0.9 10*3/uL (ref 0.7–4.0)
MCH: 31.2 pg (ref 26.0–34.0)
MCHC: 33.5 g/dL (ref 30.0–36.0)
MCV: 93 fL (ref 78.0–100.0)
Monocytes Absolute: 1.1 10*3/uL — ABNORMAL HIGH (ref 0.1–1.0)
Monocytes Relative: 13 % — ABNORMAL HIGH (ref 3–12)
NEUTROS ABS: 6.2 10*3/uL (ref 1.7–7.7)
NEUTROS PCT: 76 % (ref 43–77)
PLATELETS: 191 10*3/uL (ref 150–400)
RBC: 3.59 MIL/uL — ABNORMAL LOW (ref 3.87–5.11)
RDW: 12.6 % (ref 11.5–15.5)
WBC: 8.3 10*3/uL (ref 4.0–10.5)

## 2013-12-27 MED ORDER — BIOTENE DRY MOUTH MT LIQD
15.0000 mL | Freq: Two times a day (BID) | OROMUCOSAL | Status: DC
  Administered 2013-12-27 – 2013-12-30 (×6): 15 mL via OROMUCOSAL

## 2013-12-27 MED ORDER — SODIUM CHLORIDE 0.9 % IV BOLUS (SEPSIS)
500.0000 mL | Freq: Once | INTRAVENOUS | Status: AC
  Administered 2013-12-27: 500 mL via INTRAVENOUS

## 2013-12-27 MED ORDER — BUPIVACAINE IN DEXTROSE 0.75-8.25 % IT SOLN
INTRATHECAL | Status: DC | PRN
  Administered 2013-12-26: 15 mg via INTRATHECAL

## 2013-12-27 NOTE — Progress Notes (Signed)
[facility] PROGRESS NOTE  Whitney PatientRebecca S [name] ZOX:[id number] DOB: [birth year] DOA: 12/25/2013 PCP: Colette RibasGOLDING, JOHN CABOT, MD  Assessment/Plan: 1. Left hip fracture. Status post operative repair. Seen by physical therapy and recommended SNF placement 2. Hypertension. Continue current antihypertensive regimen. Blood pressure stable. 3. History of hyperlipidemia. Continue statin. 4. Tobacco abuse. Counseled on the importance of tobacco cessation. 5. DVT prophylaxis: Lovenox  Code Status: full code Family Communication: discussed with [name] and sister at the bedside Disposition Plan: SNF discharge, when cleared by orthopedics   Consultants:  Orthopedics, Dr. Hilda LiasKeeling  Procedures: Bipolar hip prostheses using a Smith and Nephew, size 11 stem, +4 neck, 47 mm head   Antibiotics:  HPI/Subjective: Sitting up in chair. Feeling better today.  Complains of some lightheadedness  Objective: Filed Vitals:   12/27/13 1707  BP: 153/64  Pulse: 76  Temp:   Resp:     Intake/Output Summary (Last 24 hours) at 12/27/13 1837 Last data filed at 12/27/13 1824  Gross per 24 hour  Intake   1940 ml  Output    500 ml  Net   1440 ml   Filed Weights   12/25/13 1641 12/25/13 2102  Weight: 55.339 kg (122 lb) 54.8 kg (120 lb 13 oz)    Exam:   General:  NAD  Cardiovascular: s1, s2, rrr  Respiratory: cta b  Abdomen: soft, nt, nd, bs+  Musculoskeletal:  No edema b/l   Data Reviewed: Basic Metabolic Panel:  Recent Labs Lab 12/25/13 1721 12/26/13 0527 12/27/13 0544  NA 136* 135* 132*  K 3.4* 4.2 4.0  CL 93* 95* 94*  CO2 31 32 31  GLUCOSE 115* 115* 113*  BUN 16 14 12   CREATININE 0.57 0.61 0.55  CALCIUM 9.7 8.8 8.4   Liver Function Tests:  Recent Labs Lab 12/25/13 1721  AST 22  ALT 15  ALKPHOS 74  BILITOT 0.6  PROT 7.4  ALBUMIN 3.9   No results found for this basename: LIPASE, AMYLASE,  in the last 168 hours No results found for this basename: AMMONIA,  in the  last 168 hours CBC:  Recent Labs Lab 12/25/13 1721 12/26/13 0527 12/27/13 0544  WBC 10.3 7.6 8.3  NEUTROABS 8.2*  --  6.2  HGB 13.9 12.7 11.2*  HCT 39.1 38.2 33.4*  MCV 90.7 92.5 93.0  PLT 235 236 191   Cardiac Enzymes: No results found for this basename: CKTOTAL, CKMB, CKMBINDEX, TROPONINI,  in the last 168 hours BNP (last 3 results) No results found for this basename: PROBNP,  in the last 8760 hours CBG: No results found for this basename: GLUCAP,  in the last 168 hours  Recent Results (from the past 240 hour(s))  SURGICAL PCR SCREEN     Status: None   Collection Time    12/26/13  1:07 AM      Result Value Ref Range Status   MRSA, PCR NEGATIVE  NEGATIVE Final   Staphylococcus aureus NEGATIVE  NEGATIVE Final   Comment:            The Xpert SA Assay (FDA     approved for NASAL specimens     in patients over 78 years of age),     is one component of     a comprehensive surveillance     program.  Test performance has     been validated by [facility] for patients greater     than or equal to 259 year old.  It is not intended     to diagnose infection nor to     guide or monitor treatment.  WOUND CULTURE     Status: None   Collection Time    12/26/13  8:45 AM      Result Value Ref Range Status   Specimen Description HIP LEFT   Final   Special Requests NONE   Final   Gram Stain PENDING   Incomplete   Culture     Final   Value: NO GROWTH 1 DAY     Performed at [facility]   Report Status PENDING   Incomplete     Studies: Dg Hip Portable 1 View Left  12/26/2013   CLINICAL DATA:  Postop hip radiograph  EXAM: PORTABLE LEFT HIP - 1 VIEW  COMPARISON:  DG HIP COMPLETE*L* dated 12/25/2013  FINDINGS: Single portable AP projection of radiographic image of the left hip demonstrates the sequela of left bipolar hip replacement. Alignment appears anatomic on the solitary AP projection radiograph. No definite fracture. There is expected subcutaneous emphysema about  the operative site. No definite radiopaque foreign body.  IMPRESSION: Post bipolar left hip replacement without evidence of complication.   Electronically Signed   By: Simonne ComeJohn  Watts M.D.   On: 12/26/2013 09:36    Scheduled Meds: . albuterol  2.5 mg Nebulization Q6H  . antiseptic oral rinse  15 mL Mouth Rinse BID  . aspirin EC  81 mg Oral q morning - 10a  . atorvastatin  10 mg Oral QHS  . cloNIDine  0.1 mg Oral Q supper  . cloNIDine  0.2 mg Oral Daily  . doxazosin  2 mg Oral QHS  . enoxaparin (LOVENOX) injection  40 mg Subcutaneous Q24H  . irbesartan  300 mg Oral Daily  . metoprolol succinate  100 mg Oral q morning - 10a  . nicotine  14 mg Transdermal Daily   Continuous Infusions: . sodium chloride 50 mL/hr at 12/27/13 1423    Principal Problem:   Fracture of femoral neck, left, closed Active Problems:   Essential hypertension   Tobacco abuse    Time spent: 25mins    [facility] CityJehanzeb Teila Skalsky  [facility] Pager [phone]. If 7PM-7AM, please contact night-coverage at [email or web address], password [email or web address] 12/27/2013, 6:37 PM  LOS: 2 days

## 2013-12-27 NOTE — Progress Notes (Signed)
Subjective: 1 Day Post-Op Procedure(s) (LRB): ARTHROPLASTY BIPOLAR HIP LEFT (Left) Patient reports pain as 5 on 0-10 scale.    Objective: Vital signs in last 24 hours: Temp:  [98.2 F (36.8 C)-99.9 F (37.7 C)] 98.5 F (36.9 C) (04/26 1124) Pulse Rate:  [67-88] 67 (04/26 1124) Resp:  [16-100] 18 (04/26 0800) BP: (87-143)/(49-69) 87/49 mmHg (04/26 1124) SpO2:  [20 %-100 %] 96 % (04/26 1124)  Intake/Output from previous day: 04/25 0701 - 04/26 0700 In: 2320.8 [I.V.:2320.8] Out: 725 [Urine:625; Blood:100] Intake/Output this shift: Total I/O In: 240 [P.O.:240] Out: -    Recent Labs  12/25/13 1721 12/26/13 0527 12/27/13 0544  HGB 13.9 12.7 11.2*    Recent Labs  12/26/13 0527 12/27/13 0544  WBC 7.6 8.3  RBC 4.13 3.59*  HCT 38.2 33.4*  PLT 236 191    Recent Labs  12/26/13 0527 12/27/13 0544  NA 135* 132*  K 4.2 4.0  CL 95* 94*  CO2 32 31  BUN 14 12  CREATININE 0.61 0.55  GLUCOSE 115* 113*  CALCIUM 8.8 8.4    Recent Labs  12/25/13 1721  INR 1.06    Neurologically intact Neurovascular intact Sensation intact distally Intact pulses distally Dorsiflexion/Plantar flexion intact  Physical therapy in room when I saw her.  She had a good night.    Assessment/Plan: 1 Day Post-Op Procedure(s) (LRB): ARTHROPLASTY BIPOLAR HIP LEFT (Left) Up with therapy  Darreld McleanWayne Gautam Langhorst 12/27/2013, 12:06 PM

## 2013-12-27 NOTE — Addendum Note (Signed)
Addendum created 12/27/13 0954 by Moshe SalisburyKaren E Leah Thornberry, CRNA   Modules edited: Anesthesia Medication Administration

## 2013-12-27 NOTE — Progress Notes (Addendum)
Patient's BP 87/49, pulse 67, 96% Os 2L [place].  Patient states feels "woozie".  Patient had blood pressure medication this morning and pain medication prior to therapy.  Patient up to chair by PT.  Urine pink color.  Dr. Kerry HoughMemon notified.  Gave order to give NS 500 ml bolus, if no improvement and to continue to monitor.  Stated to monitor urine that color may be from catheter insertion.

## 2013-12-27 NOTE — Addendum Note (Signed)
Addendum created 12/27/13 [id number] by Moshe SalisburyKaren E Jazlin Tapscott, CRNA   Modules edited: Charges VN

## 2013-12-27 NOTE — Evaluation (Addendum)
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  She underwent femoral hip replacement on 12-26-13.  She is normally independent at home with no assistive device.  Clinical Impression   This is a delightful pt who is seen for initial eval/tx today.  She was medicated for pain prior to tx and was instructed in posterior hip precautions.  We initiated therapeutic per posterior hip protocol which pt tolerated well.  She required mod assist to transfer supine to sit, min assist to stand to a walker. She was instructed in using a walker to transfer from bed to chair.  This was very slow but stable.  She will need SNF at d/c.  She understands and is agreeable.  We will follow per PT protocol.      Follow Up Recommendations SNF    Equipment Recommendations  Rolling walker with 5" wheels    Recommendations for Other Services   none    Precautions / Restrictions Precautions Precautions: Posterior Hip Precaution Booklet Issued: Yes (comment) Restrictions Weight Bearing Restrictions: Yes LLE Weight Bearing: Toe Touch Weight Bearing     Mobility  Bed Mobility Overal bed mobility: Needs Assistance Bed Mobility: Supine to Sit     Supine to sit: HOB elevated;Mod assist        Transfers Overall transfer level: Needs assistance Equipment used: Standard walker Transfers: Sit to/from Stand Sit to Stand: Min assist         General transfer comment: pt leans posteriorly upon standing...needed instruction to lean into walker  Ambulation/Gait Ambulation/Gait assistance: Min assist Ambulation Distance (Feet): 2 Feet Assistive device: Standard walker Gait Pattern/deviations: Decreased step length - right;Decreased step length - left     General Gait Details: pt used walker to  turn from bed to chair, needed instruction for WBAT on left  Stairs:  N/T                 Balance Overall balance assessment: Needs assistance Sitting-balance support: No upper extremity supported;Feet supported Sitting balance-Leahy Scale: Good   Postural control: Posterior lean Standing balance support: Bilateral upper extremity supported Standing balance-Leahy Scale: Fair                                 Home Living Family/patient expects to be discharged to:: Skilled nursing facility Living Arrangements: Alone                    Prior Function Level of Independence: Independent                       Extremity/Trunk Assessment   Upper Extremity Assessment: Overall WFL for tasks assessed           Lower Extremity Assessment: LLE deficits/detail   LLE Deficits / Details: ROM and strength limited by recentl surgery     Communication   Communication: No difficulties  Cognition Arousal/Alertness: Awake/alert Behavior During Therapy: WFL for tasks assessed/performed Overall Cognitive Status: Within Functional Limits for tasks assessed                            Exercises Total Joint Exercises Ankle Circles/Pumps: AROM;Both;10 reps;Supine Quad Sets:  AROM;Both;10 reps;Supine Gluteal Sets: AROM;Both;10 reps;Supine Short Arc Quad: AROM;AAROM;Both;10 reps;Supine Heel Slides: AROM;AAROM;Both;10 reps;Supine Hip ABduction/ADduction: AROM;AAROM;Both;10 reps;Supine      Assessment/Plan    PT Assessment Patient needs continued PT services  PT Diagnosis Difficulty walking;Acute pain   PT Problem List Decreased strength;Decreased range of motion;Decreased activity tolerance;Decreased balance;Decreased mobility;Decreased knowledge of use of DME;Decreased knowledge of precautions;Cardiopulmonary status limiting activity;Pain  PT Treatment Interventions DME instruction;Gait training;Functional mobility training;Therapeutic  exercise;Patient/family education   PT Goals (Current goals can be found in the Care Plan section) Acute Rehab PT Goals Patient Stated Goal: return to independence PT Goal Formulation: With patient/family Time For Goal Achievement: 01/10/14 Potential to Achieve Goals: Good    Frequency Min 6X/week   Barriers to discharge Decreased caregiver support                     End of Session Equipment Utilized During Treatment: Gait belt;Oxygen Activity Tolerance: Patient tolerated treatment well Patient left: in chair;with call bell/phone within reach;with family/visitor present Nurse Communication: Mobility status         Time: [id number] PT Time Calculation (min): 52 min   Charges:   PT Evaluation $Initial PT Evaluation Tier I: 1 Procedure PT Treatments $Therapeutic Exercise: 8-22 mins   PT G Codes:          Konrad PentaClaudia L Kiyani Jernigan 12/27/2013, 10:43 AM

## 2013-12-27 NOTE — Anesthesia Postprocedure Evaluation (Signed)
  Anesthesia Post-op Note  Patient: Whitney [name]  Procedure(s) Performed: Procedure(s): ARTHROPLASTY BIPOLAR HIP LEFT (Left)  Patient Location: [facility]  Anesthesia Type:Spinal  Level of Consciousness: awake, alert  and oriented  Airway and Oxygen Therapy: Patient Spontanous Breathing  Post-op Pain: none  Post-op Assessment: Post-op Vital signs reviewed, Patient's Cardiovascular Status Stable, Respiratory Function Stable, Patent Airway and No signs of Nausea or vomiting  Post-op Vital Signs: Reviewed and stable  Last Vitals:  Filed Vitals:   12/27/13 1417  BP: 106/57  Pulse: 69  Temp: 36.7 C  Resp:     Complications: No apparent anesthesia complications

## 2013-12-27 NOTE — Addendum Note (Signed)
Addendum created 12/27/13 1618 by Moshe SalisburyKaren E Keyontay Stolz, CRNA   Modules edited: Notes Section   Notes Section:  File: [id number]

## 2013-12-28 ENCOUNTER — Inpatient Hospital Stay (HOSPITAL_COMMUNITY): Payer: Medicare Other

## 2013-12-28 DIAGNOSIS — D62 Acute posthemorrhagic anemia: Secondary | ICD-10-CM | POA: Diagnosis not present

## 2013-12-28 DIAGNOSIS — J9601 Acute respiratory failure with hypoxia: Secondary | ICD-10-CM | POA: Diagnosis not present

## 2013-12-28 DIAGNOSIS — R0902 Hypoxemia: Secondary | ICD-10-CM

## 2013-12-28 LAB — BASIC METABOLIC PANEL
BUN: 14 mg/dL (ref 6–23)
CHLORIDE: 96 meq/L (ref 96–112)
CO2: 31 mEq/L (ref 19–32)
Calcium: 8.3 mg/dL — ABNORMAL LOW (ref 8.4–10.5)
Creatinine, Ser: 0.59 mg/dL (ref 0.50–1.10)
GFR calc non Af Amer: 84 mL/min — ABNORMAL LOW (ref >90)
Glucose, Bld: 131 mg/dL — ABNORMAL HIGH (ref 70–99)
POTASSIUM: 4.2 meq/L (ref 3.7–5.3)
Sodium: 133 mEq/L — ABNORMAL LOW (ref 137–147)

## 2013-12-28 LAB — CBC WITH DIFFERENTIAL/PLATELET
BASOS PCT: 0 % (ref 0–1)
Basophils Absolute: 0 10*3/uL (ref 0.0–0.1)
Eosinophils Absolute: 0 10*3/uL (ref 0.0–0.7)
Eosinophils Relative: 1 % (ref 0–5)
HEMATOCRIT: 29.4 % — AB (ref 36.0–46.0)
HEMOGLOBIN: 9.9 g/dL — AB (ref 12.0–15.0)
LYMPHS PCT: 11 % — AB (ref 12–46)
Lymphs Abs: 0.9 10*3/uL (ref 0.7–4.0)
MCH: 31.2 pg (ref 26.0–34.0)
MCHC: 33.7 g/dL (ref 30.0–36.0)
MCV: 92.7 fL (ref 78.0–100.0)
MONO ABS: 1.1 10*3/uL — AB (ref 0.1–1.0)
MONOS PCT: 14 % — AB (ref 3–12)
NEUTROS ABS: 6.3 10*3/uL (ref 1.7–7.7)
Neutrophils Relative %: 74 % (ref 43–77)
Platelets: 166 10*3/uL (ref 150–400)
RBC: 3.17 MIL/uL — AB (ref 3.87–5.11)
RDW: 12.6 % (ref 11.5–15.5)
WBC: 8.4 10*3/uL (ref 4.0–10.5)

## 2013-12-28 LAB — URINALYSIS, ROUTINE W REFLEX MICROSCOPIC
BILIRUBIN URINE: NEGATIVE
Glucose, UA: 100 mg/dL — AB
Ketones, ur: NEGATIVE mg/dL
LEUKOCYTES UA: NEGATIVE
NITRITE: NEGATIVE
SPECIFIC GRAVITY, URINE: 1.02 (ref 1.005–1.030)
Urobilinogen, UA: 1 mg/dL (ref 0.0–1.0)
pH: 6 (ref 5.0–8.0)

## 2013-12-28 LAB — URINE MICROSCOPIC-ADD ON

## 2013-12-28 NOTE — Progress Notes (Signed)
Subjective: 2 Days Post-Op Procedure(s) (LRB): ARTHROPLASTY BIPOLAR HIP LEFT (Left) Patient reports pain as 3 on 0-10 scale.    Objective: Vital signs in last 24 hours: Temp:  [98 F (36.7 C)-99.6 F (37.6 C)] 98.6 F (37 C) (04/27 0657) Pulse Rate:  [65-83] 76 (04/27 0657) Resp:  [16-20] 16 (04/27 0657) BP: (87-153)/(49-65) 137/65 mmHg (04/27 0657) SpO2:  [93 %-97 %] 97 % (04/27 0703)  Intake/Output from previous day: 04/26 0701 - 04/27 0700 In: 1940 [P.O.:720; I.V.:1220] Out: 575 [Urine:550; Drains:25] Intake/Output this shift:     Recent Labs  12/25/13 1721 12/26/13 0527 12/27/13 0544 12/28/13 0448  HGB 13.9 12.7 11.2* 9.9*    Recent Labs  12/27/13 0544 12/28/13 0448  WBC 8.3 8.4  RBC 3.59* 3.17*  HCT 33.4* 29.4*  PLT 191 166    Recent Labs  12/27/13 0544 12/28/13 0448  NA 132* 133*  K 4.0 4.2  CL 94* 96  CO2 31 31  BUN 12 14  CREATININE 0.55 0.59  GLUCOSE 113* 131*  CALCIUM 8.4 8.3*    Recent Labs  12/25/13 1721  INR 1.06    Neurologically intact Neurovascular intact Sensation intact distally Intact pulses distally Dorsiflexion/Plantar flexion intact Incision: scant drainage  She did well with PT yesterday.  She was up in chair about four hours.  To progress with PT.  Assessment/Plan: 2 Days Post-Op Procedure(s) (LRB): ARTHROPLASTY BIPOLAR HIP LEFT (Left) Up with therapy  Whitney [name] 12/28/2013, 8:22 AM

## 2013-12-28 NOTE — Progress Notes (Addendum)
[facility] PROGRESS NOTE  Whitney [name] PatientRebecca S Caroll ZOX:[id number] DOB: 1930/01/08 DOA: 12/25/2013 PCP: Colette RibasGOLDING, JOHN CABOT, MD  Assessment/Plan: 1. Left hip fracture. Status post operative repair. Seen by physical therapy and recommended SNF placement 2. Hypertension. Continue current antihypertensive regimen. Blood pressure stable. 3. History of hyperlipidemia. Continue statin. 4. Tobacco abuse. Counseled on the importance of tobacco cessation. 5. DVT prophylaxis: Lovenox 6. Hypoxia.  Patient appears to desaturate once off oxygen.  IV fluids discontinued.  Will check chest xray. 7. Acute blood loss anemia, likely related to surgery and dilutional from IV fluids.  No clear source of ongoing bleeding. Will continue to follow hemoglobin  Code Status: full code Family Communication: discussed with patient and sister at the bedside Disposition Plan: SNF discharge, when cleared by orthopedics   Consultants:  Orthopedics, Dr. Hilda LiasKeeling  Procedures: Bipolar hip prostheses using a [employer], size 11 stem, +4 neck, 47 mm head   Antibiotics:  HPI/Subjective: Sitting up in chair. Feeling better today.  No shortness of breath.  Pain appears to be reasonably controlled.  Objective: Filed Vitals:   12/28/13 0657  BP: 137/65  Pulse: 76  Temp: 98.6 F (37 C)  Resp: 16    Intake/Output Summary (Last 24 hours) at 12/28/13 1632 Last data filed at 12/28/13 1325  Gross per 24 hour  Intake    720 ml  Output    435 ml  Net    285 ml   Filed Weights   12/25/13 1641 12/25/13 2102  Weight: 55.339 kg (122 lb) 54.8 kg (120 lb 13 oz)    Exam:   General:  NAD  Cardiovascular: s1, s2, rrr  Respiratory: cta b  Abdomen: soft, nt, nd, bs+  Musculoskeletal:  No edema b/l   Data Reviewed: Basic Metabolic Panel:  Recent Labs Lab 12/25/13 1721 12/26/13 0527 12/27/13 0544 12/28/13 0448  NA 136* 135* 132* 133*  K 3.4* 4.2 4.0 4.2  CL 93* 95* 94* 96  CO2 31 32 31 31   GLUCOSE 115* 115* 113* 131*  BUN 16 14 12 14   CREATININE 0.57 0.61 0.55 0.59  CALCIUM 9.7 8.8 8.4 8.3*   Liver Function Tests:  Recent Labs Lab 12/25/13 1721  AST 22  ALT 15  ALKPHOS 74  BILITOT 0.6  PROT 7.4  ALBUMIN 3.9   No results found for this basename: LIPASE, AMYLASE,  in the last 168 hours No results found for this basename: AMMONIA,  in the last 168 hours CBC:  Recent Labs Lab 12/25/13 1721 12/26/13 0527 12/27/13 0544 12/28/13 0448  WBC 10.3 7.6 8.3 8.4  NEUTROABS 8.2*  --  6.2 6.3  HGB 13.9 12.7 11.2* 9.9*  HCT 39.1 38.2 33.4* 29.4*  MCV 90.7 92.5 93.0 92.7  PLT 235 236 191 166   Cardiac Enzymes: No results found for this basename: CKTOTAL, CKMB, CKMBINDEX, TROPONINI,  in the last 168 hours BNP (last 3 results) No results found for this basename: PROBNP,  in the last 8760 hours CBG: No results found for this basename: GLUCAP,  in the last 168 hours  Recent Results (from the past 240 hour(s))  SURGICAL PCR SCREEN     Status: None   Collection Time    12/26/13  1:07 AM      Result Value Ref Range Status   MRSA, PCR NEGATIVE  NEGATIVE Final   Staphylococcus aureus NEGATIVE  NEGATIVE Final   Comment:            The Xpert SA  Assay (FDA     approved for NASAL specimens     in patients over 78 years of age),     is one component of     a comprehensive surveillance     program.  Test performance has     been validated by [facility] for patients greater     than or equal to 712 year old.     It is not intended     to diagnose infection nor to     guide or monitor treatment.  WOUND CULTURE     Status: None   Collection Time    12/26/13  8:45 AM      Result Value Ref Range Status   Specimen Description WOUND LEFT HIP   Final   Special Requests NONE   Final   Gram Stain     Final   Value: NO WBC SEEN     NO SQUAMOUS EPITHELIAL CELLS SEEN     NO ORGANISMS SEEN     Performed at [facility]   Culture     Final   Value: NO GROWTH 2  DAYS     Performed at [facility]   Report Status PENDING   Incomplete     Studies: No results found.  Scheduled Meds: . albuterol  2.5 mg Nebulization Q6H  . antiseptic oral rinse  15 mL Mouth Rinse BID  . aspirin EC  81 mg Oral q morning - 10a  . atorvastatin  10 mg Oral QHS  . cloNIDine  0.1 mg Oral Q supper  . cloNIDine  0.2 mg Oral Daily  . doxazosin  2 mg Oral QHS  . enoxaparin (LOVENOX) injection  40 mg Subcutaneous Q24H  . irbesartan  300 mg Oral Daily  . metoprolol succinate  100 mg Oral q morning - 10a  . nicotine  14 mg Transdermal Daily   Continuous Infusions:    Principal Problem:   Fracture of femoral neck, left, closed Active Problems:   Essential hypertension   Tobacco abuse    Time spent: 25mins    Darden RestaurantsJehanzeb Memon  [facility] Pager [phone]. If 7PM-7AM, please contact night-coverage at [email or web address], password [email or web address] 12/28/2013, 4:32 PM  LOS: 3 days

## 2013-12-28 NOTE — Addendum Note (Signed)
Addendum created 12/28/13 0759 by Moshe SalisburyKaren E Joseluis Alessio, CRNA   Modules edited: Anesthesia Responsible Staff

## 2013-12-28 NOTE — Progress Notes (Signed)
Oxygen saturation on room air 67%, patient received breathing treatment, applied oxygen at 2L/[place], oxygen saturation increased to 88%.

## 2013-12-28 NOTE — Care Management Note (Addendum)
    Page 1 of 1   12/30/2013     4:23:40 PM CARE MANAGEMENT NOTE 12/30/2013  Patient:  Whitney [name],Whitney [name]   Account Number:  [id number]  Date Initiated:  12/28/2013  Documentation initiated by:  Whitney [name],Whitney [name] Begin  Subjective/Objective Assessment:   Pt admitted from home with a hip Fx. Has had surgery, and will now need short-term SNF/ rehab.     Action/Plan:   CSW aware of SNF need   Anticipated DC Date:  12/29/2013   Anticipated DC Plan:  SKILLED NURSING FACILITY  In-house referral  Clinical Social Worker      DC Planning Services  CM consult      Choice offered to / List presented to:             Status of service:  Completed, signed off Medicare Important Message given?  YES (If response is "NO", the following Medicare IM given date fields will be blank) Date Medicare IM given:  12/29/2013 Date Additional Medicare IM given:    Discharge Disposition:    Per UR Regulation:    If discussed at Long Length of Stay Meetings, dates discussed:    Comments:  12/30/13 1600  Whitney [name] 12/28/13 1500 Whitney HendersonGeneva Marialy Urbanczyk [name]

## 2013-12-28 NOTE — Progress Notes (Signed)
Oxygen saturation 88% on 2L/[place], increased oxygen to 4L/[place], saturation increased to 92%.

## 2013-12-28 NOTE — Clinical Documentation Improvement (Signed)
12/28/13  Possible Clinical Conditions?   Expected Acute Blood Loss Anemia  Acute Blood Loss Anemia  Acute on chronic blood loss anemia  Precipitous drop in Hematocrit  Other Condition (please specify)    Risk Factors: recent surgery THR Supporting Information: Hgb 4/24 13.9, H/H: 4/25 12.7/38.2,  4/26 11.2/33.4,  4/27 9.9/29.4 IV fluids / plasma expanders: NS IV 50 ml/hr Serial H&H monitoring: BMP qd x3, CBC qd x3  Thank You, Beverley FiedlerLaurie E Junice Fei ,RN Clinical Documentation Specialist:  [phone]  [facility]- Health Information Management

## 2013-12-28 NOTE — Progress Notes (Addendum)
Physical Therapy Treatment Patient Details Name: Whitney [name] MRN: [id number] DOB: 1929-12-07 Today's Date: 12/28/2013    History of Present Illness Pt voices no c/o today...states that she slept well last night.    PT Comments    Pt is progressing rapidly.  She is able to perform short arc quad exercise independently on the left.  She now needs only min assist to transfer supine to sit and has no posterior lean upon standing.  Her foley catheter has been d/c'd and so she was [facility]  Pt was instructed in gait with a walker, TTWB left and was able to ambulate 15' with min assist.  Pt is highly motivated and very cooperative.   Follow Up Recommendations   SNF     Equipment Recommendations    none   Recommendations for Other Services  none     Precautions / Restrictions Precautions Precautions: Posterior Hip Restrictions Weight Bearing Restrictions: No LLE Weight Bearing: Weight bearing as tolerated    Mobility  Bed Mobility   Bed Mobility: Supine to Sit     Supine to sit: HOB elevated;Min assist        Transfers Overall transfer level: Needs assistance Equipment used: Rolling walker (2 wheeled) Transfers: Squat Pivot Transfers Sit to Stand: Min assist   Squat pivot transfers: Min assist     General transfer comment: pt instructed in [facility]...in stance, she is now able to stand erect with no posterior lean  Ambulation/Gait Ambulation/Gait assistance: Min assist Ambulation Distance (Feet): 15 Feet Assistive device: Rolling walker (2 wheeled) Gait Pattern/deviations: Decreased step length - right;Decreased step length - left         Stairs N/T                                                         Cognition Arousal/Alertness: Awake/alert Behavior During Therapy: WFL for tasks assessed/performed Overall Cognitive Status: Within Functional Limits for tasks assessed                      Exercises Total Joint Exercises Ankle Circles/Pumps: AROM;Both;10 reps;Supine Quad Sets: AROM;Both;10 reps;Supine Gluteal Sets: AROM;Both;10 reps;Supine Short Arc Quad: AROM;Both;10 reps;Supine Heel Slides: AROM;AAROM;Both;10 reps;Supine Hip ABduction/ADduction: AROM;AAROM;Both;10 reps;Supine                                                PT Goals (current goals can now be found in the care plan section) Progress towards PT goals: Progressing toward goals          PT Plan Current plan remains appropriate                 End of Session Equipment Utilized During Treatment: Gait belt;Oxygen Activity Tolerance: Patient tolerated treatment well Patient left: in chair;with call bell/phone within reach     Time: 1012-1050 PT Time Calculation (min): 38 min  Charges:  $Gait Training: 8-22 mins $Therapeutic Exercise: 8-22 mins                    G Codes:      Konrad PentaClaudia L Ople Girgis 12/28/2013, 11:22 AM

## 2013-12-28 NOTE — Care Management Utilization Note (Signed)
UR completed 

## 2013-12-28 NOTE — Clinical Social Work Psychosocial (Signed)
Clinical Social Work Department BRIEF PSYCHOSOCIAL ASSESSMENT 12/28/2013  Patient:  Whitney [name], Whitney [name]     Account Number:  [id number]     Admit date:  12/25/2013  Clinical Social Worker:  Daiva Huge  Date/Time:  12/28/2013 11:02 AM  Referred by:  Physician  Date Referred:  12/28/2013 Referred for  SNF Placement   Other Referral:   Interview type:  Other - See comment Other interview type:   Spoke with patient's cousin/POAAldona Bar [phone]    PSYCHOSOCIAL DATA Living Status:  ALONE Admitted from facility:   Level of care:   Primary support name:  sister/cousin Primary support relationship to patient:  FAMILY Degree of support available:   good but physical assist    CURRENT CONCERNS Current Concerns  Post-Acute Placement   Other Concerns:    SOCIAL WORK ASSESSMENT / PLAN Met with patient's cousin/POA in hallway while PT was working with patient- patient lives alone and is quite independent prior to her fall with hip fx.  Patient is a Psychologist, occupational here- they are interested in SNF at [facility]-   Assessment/plan status:  Other - See comment Other assessment/ plan:   FL2 and PASARR completion for SNF search   Information/referral to community resources:   SNF list    PATIENT'S/FAMILY'S RESPONSE TO PLAN OF CARE: Patient's family indicates the preference for SNF is [facility]- I have contacted [facility] as did family to make them aware of their request. Pending bed available at time of dc this will be our plan- however did ask them to consider backup SNF options in case there are no beds at [facility].  CSW will plan to f/u and speak with patient once she is available-       Eduard Clos, MSW, [place]

## 2013-12-28 NOTE — Clinical Social Work Placement (Signed)
Clinical Social Work Department CLINICAL SOCIAL WORK PLACEMENT NOTE 12/28/2013  Patient:  Randell PatientROBERTSON,Lucindy S  Account Number:  [id number] Admit date:  12/25/2013  Clinical Social Worker:  Robin SearingJANET Alonzo Owczarzak, LCSWA  Date/time:  12/28/2013 11:10 AM  Clinical Social Work is seeking post-discharge placement for this patient at the following level of care:   SKILLED NURSING   (*CSW will update this form in Epic as items are completed)   12/28/2013  Patient/family provided with [facility] of Clinical Social Work's list of facilities offering this level of care within the geographic area requested by the patient (or if unable, by the patient's family).  12/28/2013  Patient/family informed of their freedom to choose among providers that offer the needed level of care, that participate in Medicare, Medicaid or managed care program needed by the patient, have an available bed and are willing to accept the patient.  12/28/2013  Patient/family informed of [facility]' ownership interest in [facility], as well as of the fact that they are under no obligation to receive care at this facility.  PASARR submitted to E[employer] on 12/28/2013 PASARR number received from [employer] on 12/28/2013  FL2 transmitted to all facilities in geographic area requested by pt/family on  12/28/2013 FL2 transmitted to all facilities within larger geographic area on   Patient informed that his/her managed care company has contracts with or will negotiate with  certain facilities, including the following:     Patient/family informed of bed offers received:   Patient chooses bed at  Physician recommends and patient chooses bed at    Patient to be transferred to  on   Patient to be transferred to facility by   The following physician request were entered in Epic:   Additional Comments: Reece LevyJanet Lyzette Reinhardt, MSW, [employer]CSWA [phone]

## 2013-12-29 ENCOUNTER — Encounter (HOSPITAL_COMMUNITY): Payer: Self-pay | Admitting: Orthopaedic Surgery

## 2013-12-29 DIAGNOSIS — D62 Acute posthemorrhagic anemia: Secondary | ICD-10-CM

## 2013-12-29 LAB — BASIC METABOLIC PANEL
BUN: 16 mg/dL (ref 6–23)
CALCIUM: 8.2 mg/dL — AB (ref 8.4–10.5)
CHLORIDE: 96 meq/L (ref 96–112)
CO2: 30 mEq/L (ref 19–32)
CREATININE: 0.55 mg/dL (ref 0.50–1.10)
GFR calc non Af Amer: 86 mL/min — ABNORMAL LOW (ref >90)
Glucose, Bld: 113 mg/dL — ABNORMAL HIGH (ref 70–99)
Potassium: 4.3 mEq/L (ref 3.7–5.3)
Sodium: 134 mEq/L — ABNORMAL LOW (ref 137–147)

## 2013-12-29 LAB — CBC WITH DIFFERENTIAL/PLATELET
Basophils Absolute: 0 10*3/uL (ref 0.0–0.1)
Basophils Relative: 0 % (ref 0–1)
EOS PCT: 2 % (ref 0–5)
Eosinophils Absolute: 0.1 10*3/uL (ref 0.0–0.7)
HCT: 28.6 % — ABNORMAL LOW (ref 36.0–46.0)
HEMOGLOBIN: 9.7 g/dL — AB (ref 12.0–15.0)
Lymphocytes Relative: 14 % (ref 12–46)
Lymphs Abs: 0.9 10*3/uL (ref 0.7–4.0)
MCH: 31.4 pg (ref 26.0–34.0)
MCHC: 33.9 g/dL (ref 30.0–36.0)
MCV: 92.6 fL (ref 78.0–100.0)
MONOS PCT: 14 % — AB (ref 3–12)
Monocytes Absolute: 0.9 10*3/uL (ref 0.1–1.0)
Neutro Abs: 4.8 10*3/uL (ref 1.7–7.7)
Neutrophils Relative %: 70 % (ref 43–77)
Platelets: 198 10*3/uL (ref 150–400)
RBC: 3.09 MIL/uL — ABNORMAL LOW (ref 3.87–5.11)
RDW: 12.7 % (ref 11.5–15.5)
WBC: 6.7 10*3/uL (ref 4.0–10.5)

## 2013-12-29 LAB — WOUND CULTURE
Culture: NO GROWTH
GRAM STAIN: NONE SEEN

## 2013-12-29 LAB — URINE CULTURE
COLONY COUNT: NO GROWTH
Culture: NO GROWTH

## 2013-12-29 LAB — TYPE AND SCREEN
ABO/RH(D): A NEG
Antibody Screen: NEGATIVE
UNIT DIVISION: 0
Unit division: 0

## 2013-12-29 MED ORDER — FUROSEMIDE 10 MG/ML IJ SOLN
20.0000 mg | Freq: Once | INTRAMUSCULAR | Status: AC
  Administered 2013-12-29: 20 mg via INTRAVENOUS
  Filled 2013-12-29: qty 2

## 2013-12-29 NOTE — Clinical Social Work Note (Signed)
[facility]- patient shared with CSW that she hasn't smoked since last week when she came in- we talked about how now is a great opportunity to stop smoking since she won't be able to smoke while in SNF rehab- she is open to this and seems motivated as does her niece who also admits to smoking- encouragement and support provided.    Whitney LevyJanet Arthea Nobel, MSW, Theresia MajorsLCSWA [phone]

## 2013-12-29 NOTE — Progress Notes (Signed)
Physical Therapy Treatment Patient Details Name: Whitney [name] MRN: [id number] DOB: [birth year] Today's Date: 12/29/2013    History of Present Illness Pt [facility].  Pain is well controlled but she feels excessively drowsy from meds.  Would like a change of pain med if possible.    PT Comments    Pt tolerated therapeutic exercise well, now able to do short arc quad independently.  Min assist needed to transfer to EOB and to stand to a walker.  Instructed in gait with walker, TTWB LLE.  Pt was able to walk 15' with min assist to help guide walker with turns.  She is progressing well  Follow Up Recommendations   SNF     Equipment Recommendations    none   Recommendations for Other Services  none     Precautions / Restrictions Precautions Precautions: Fall Restrictions Weight Bearing Restrictions: Yes LLE Weight Bearing: Touchdown weight bearing    Mobility  Bed Mobility         Supine to sit: HOB elevated;Min assist        Transfers   Equipment used: Rolling walker (2 wheeled) Transfers: Squat Pivot Transfers Sit to Stand: Min assist   Squat pivot transfers: Min assist        Ambulation/Gait Ambulation/Gait assistance: Min assist Ambulation Distance (Feet): 15 Feet Assistive device: Rolling walker (2 wheeled) Gait Pattern/deviations: Decreased stance time - left;Decreased step length - left         Stairs:  N/T                                                         Cognition Arousal/Alertness: Awake/alert Behavior During Therapy: WFL for tasks assessed/performed Overall Cognitive Status: Within Functional Limits for tasks assessed                      Exercises Total Joint Exercises Ankle Circles/Pumps: AROM;Both;10 reps;Supine Quad Sets: AROM;Both;10 reps;Supine Gluteal Sets: AROM;Both;10 reps;Supine Short Arc Quad: AROM;Both;10 reps;Supine Heel  Slides: AROM;Both;10 reps;Supine Hip ABduction/ADduction: AROM;Both;10 reps;Supine                                                   PT Goals (current goals can now be found in the care plan section) Progress towards PT goals: Progressing toward goals          PT Plan Current plan remains appropriate                 End of Session Equipment Utilized During Treatment: Gait belt Activity Tolerance: Patient tolerated treatment well Patient left: in chair;with call bell/phone within reach     Time: 1035-1130 PT Time Calculation (min): 55 min  Charges:  $Gait Training: 8-22 mins $Therapeutic Exercise: 8-22 mins                    G Codes:      Konrad PentaClaudia L Cherron Blitzer 12/29/2013, 11:52 AM

## 2013-12-29 NOTE — Progress Notes (Addendum)
[facility] PROGRESS NOTE  TENNA LACKO ZOX:[id number] DOB: November 25, 1929 DOA: 12/25/2013 PCP: [name], MD  Summary:  This is a 80 year old female who had a mechanical fall prior to admission resulting in a left hip fracture. Patient underwent operative repair without any immediate complications. Postoperatively, she is in very well from a functional standpoint and is awaiting transfer to a skilled nursing facility. She did develop some significant hypoxia requiring supplemental oxygen. Chest x-ray did indicate signs of volume overload, likely related to IV fluid administration. She has been given low-dose Lasix. If she does improve with these measures, they can likely be continued at the skilled nursing facility. Anticipate that she'll be ready for discharge in the next 24-48 hours.  Assessment/Plan:  1. Left hip fracture. Status post operative repair. Seen by physical therapy and recommended SNF placement 2. Hypertension. Continue current antihypertensive regimen. Blood pressure stable. 3. History of hyperlipidemia. Continue statin. 4. Tobacco abuse. Counseled on the importance of tobacco cessation. 5. DVT prophylaxis: Lovenox 6. Acute respiratory failure. Patient appears to desaturate once off oxygen. IV fluids discontinued. Chest x-ray indicates mild volume overload. She'll be given one dose of IV Lasix today. If she does show improvement, she can likely be discharged on a low dose of Lasix for the next few days. 7. Acute blood loss anemia, likely related to surgery and dilutional from IV fluids. No clear source of ongoing bleeding. Hemoglobin has been stable  Code Status: full code  Family Communication: discussed with patient and sister at the bedside  Disposition Plan: SNF discharge, possibly in a.m.   Consultants:  Orthopedics, Dr. Hilda Lias  Procedures:  Bipolar hip prostheses using a [employer], size 11 stem, +4 neck, 47 mm head    Antibiotics:   HPI/Subjective: No new complaints. Pain appears to be adequately controlled. Does not feel particularly short of breath.  Objective: Filed Vitals:   12/29/13 1639  BP: 159/71  Pulse: 90  Temp:   Resp:     Intake/Output Summary (Last 24 hours) at 12/29/13 1859 Last data filed at 12/29/13 1752  Gross per 24 hour  Intake    480 ml  Output    802 ml  Net   -322 ml   Filed Weights   12/25/13 1641 12/25/13 2102  Weight: 55.339 kg (122 lb) 54.8 kg (120 lb 13 oz)    Exam:   General:  NAD  Cardiovascular: S1, S2 RRR  Respiratory: crackles at bases  Abdomen: soft, nt, nd, bs+  Musculoskeletal: 1+ edema b/l   Data Reviewed: Basic Metabolic Panel:  Recent Labs Lab 12/25/13 1721 12/26/13 0527 12/27/13 0544 12/28/13 0448 12/29/13 0444  NA 136* 135* 132* 133* 134*  K 3.4* 4.2 4.0 4.2 4.3  CL 93* 95* 94* 96 96  CO2 31 32 31 31 30   GLUCOSE 115* 115* 113* 131* 113*  BUN 16 14 12 14 16   CREATININE 0.57 0.61 0.55 0.59 0.55  CALCIUM 9.7 8.8 8.4 8.3* 8.2*   Liver Function Tests:  Recent Labs Lab 12/25/13 1721  AST 22  ALT 15  ALKPHOS 74  BILITOT 0.6  PROT 7.4  ALBUMIN 3.9   No results found for this basename: LIPASE, AMYLASE,  in the last 168 hours No results found for this basename: AMMONIA,  in the last 168 hours CBC:  Recent Labs Lab 12/25/13 1721 12/26/13 0527 12/27/13 0544 12/28/13 0448 12/29/13 0444  WBC 10.3 7.6 8.3 8.4 6.7  NEUTROABS 8.2*  --  6.2 6.3 4.8  HGB 13.9 12.7 11.2* 9.9* 9.7*  HCT 39.1 38.2 33.4* 29.4* 28.6*  MCV 90.7 92.5 93.0 92.7 92.6  PLT 235 236 191 166 198   Cardiac Enzymes: No results found for this basename: CKTOTAL, CKMB, CKMBINDEX, TROPONINI,  in the last 168 hours BNP (last 3 results) No results found for this basename: PROBNP,  in the last 8760 hours CBG: No results found for this basename: GLUCAP,  in the last 168 hours  Recent Results (from the past 240 hour(s))  SURGICAL PCR SCREEN      Status: None   Collection Time    12/26/13  1:07 AM      Result Value Ref Range Status   MRSA, PCR NEGATIVE  NEGATIVE Final   Staphylococcus aureus NEGATIVE  NEGATIVE Final   Comment:            The Xpert SA Assay (FDA     approved for NASAL specimens     in patients over 78 years of age),     is one component of     a comprehensive surveillance     program.  Test performance has     been validated by [facility] for patients greater     than or equal to 752 year old.     It is not intended     to diagnose infection nor to     guide or monitor treatment.  WOUND CULTURE     Status: None   Collection Time    12/26/13  8:45 AM      Result Value Ref Range Status   Specimen Description WOUND LEFT HIP   Final   Special Requests NONE   Final   Gram Stain     Final   Value: NO WBC SEEN     NO SQUAMOUS EPITHELIAL CELLS SEEN     NO ORGANISMS SEEN     Performed at [facility]   Culture     Final   Value: NO GROWTH 2 DAYS     Performed at [facility]   Report Status 12/29/2013 FINAL   Final  URINE CULTURE     Status: None   Collection Time    12/28/13 10:20 AM      Result Value Ref Range Status   Specimen Description URINE, CATHETERIZED   Final   Special Requests NONE   Final   Culture  Setup Time     Final   Value: 12/28/2013 13:50     Performed at [facility]   Colony Count     Final   Value: NO GROWTH     Performed at [facility]   Culture     Final   Value: NO GROWTH     Performed at [facility]   Report Status 12/29/2013 FINAL   Final     Studies: Dg Chest Port 1 View  12/28/2013   CLINICAL DATA:  Cough, congestion and decreased oxygen saturations. Recent total hip replacement.  EXAM: PORTABLE CHEST - 1 VIEW  COMPARISON:  Chest x-ray 12/25/2013.  FINDINGS: Mild emphysematous changes are again noted. Lungs appear hypoventilatory. Bibasilar opacities may reflect areas of atelectasis and/or consolidation, with superimposed  small to moderate right and small left pleural effusions. There is cephalization of the pulmonary vasculature and slight indistinctness of the interstitial markings suggestive of mild pulmonary edema. Heart size appears borderline enlarged, likely accentuated in part by patient's rotation to the left  which helps with distorts upper mediastinal contours. Atherosclerosis in the thoracic aorta.  IMPRESSION: 1. The appearance of the chest suggests mild congestive heart failure, as above. 2. Bibasilar opacities are most likely related to postoperative hypoventilation and atelectasis, although underlying airspace consolidation from developing infection or sequela of recent aspiration is not excluded. 3. Atherosclerosis.   Electronically Signed   By: Trudie Reedaniel  Entrikin M.D.   On: 12/28/2013 17:24    Scheduled Meds: . antiseptic oral rinse  15 mL Mouth Rinse BID  . aspirin EC  81 mg Oral q morning - 10a  . atorvastatin  10 mg Oral QHS  . cloNIDine  0.1 mg Oral Q supper  . cloNIDine  0.2 mg Oral Daily  . doxazosin  2 mg Oral QHS  . enoxaparin (LOVENOX) injection  40 mg Subcutaneous Q24H  . irbesartan  300 mg Oral Daily  . metoprolol succinate  100 mg Oral q morning - 10a  . nicotine  14 mg Transdermal Daily   Continuous Infusions:   Principal Problem:   Fracture of femoral neck, left, closed Active Problems:   Essential hypertension   Tobacco abuse   Acute blood loss anemia   Hypoxia    Time spent: 30mins    Darden RestaurantsJehanzeb Doug Bucklin  [facility] Pager [phone]. If 7PM-7AM, please contact night-coverage at [email or web address], password [email or web address] 12/29/2013, 6:59 PM  LOS: 4 days

## 2013-12-29 NOTE — Progress Notes (Signed)
Subjective: 3 Days Post-Op Procedure(s) (LRB): ARTHROPLASTY BIPOLAR HIP LEFT (Left) Patient reports pain as 3 on 0-10 scale.    Objective: Vital signs in last 24 hours: Temp:  [98.6 F (37 C)] 98.6 F (37 C) (04/28 0514) Pulse Rate:  [79-86] 79 (04/28 0514) Resp:  [12-20] 18 (04/28 0514) BP: (121-122)/(53-54) 122/54 mmHg (04/28 0514) SpO2:  [67 %-96 %] 96 % (04/28 0707)  Intake/Output from previous day: 04/27 0701 - 04/28 0700 In: 740 [P.O.:740] Out: 163 [Urine:160; Stool:3] Intake/Output this shift:     Recent Labs  12/27/13 0544 12/28/13 0448 12/29/13 0444  HGB 11.2* 9.9* 9.7*    Recent Labs  12/28/13 0448 12/29/13 0444  WBC 8.4 6.7  RBC 3.17* 3.09*  HCT 29.4* 28.6*  PLT 166 198    Recent Labs  12/28/13 0448 12/29/13 0444  NA 133* 134*  K 4.2 4.3  CL 96 96  CO2 31 30  BUN 14 16  CREATININE 0.59 0.55  GLUCOSE 131* 113*  CALCIUM 8.3* 8.2*   No results found for this basename: LABPT, INR,  in the last 72 hours  Neurologically intact Neurovascular intact Sensation intact distally Intact pulses distally Dorsiflexion/Plantar flexion intact Incision: no drainage  She did well in therapy.  She is well motivated.  She should be ready soon for SNF.  Plans for nursing home:  She will need to have sutures removed and Steri-strip wound on May 5th. She is continue enoxaparin daily for one month following surgery. She is to use walker and have toe touch on the left in physical therapy. I will need to see her in my office in one month.  X-rays of the left hip prior to the appointment if still at SNF.  Assessment/Plan: 3 Days Post-Op Procedure(s) (LRB): ARTHROPLASTY BIPOLAR HIP LEFT (Left) Up with therapy  Darreld McleanWayne Wes Lezotte 12/29/2013, 7:26 AM

## 2013-12-30 ENCOUNTER — Inpatient Hospital Stay
Admission: RE | Admit: 2013-12-30 | Discharge: 2014-02-05 | Disposition: A | Discharge status: Home / Self Care | Payer: Medicare Other | Source: Ambulatory Visit | Attending: Internal Medicine | Admitting: Internal Medicine

## 2013-12-30 DIAGNOSIS — S72002A Fracture of unspecified part of neck of left femur, initial encounter for closed fracture: Principal | ICD-10-CM

## 2013-12-30 DIAGNOSIS — J9600 Acute respiratory failure, unspecified whether with hypoxia or hypercapnia: Secondary | ICD-10-CM

## 2013-12-30 LAB — CBC
HCT: 28.9 % — ABNORMAL LOW (ref 36.0–46.0)
Hemoglobin: 9.7 g/dL — ABNORMAL LOW (ref 12.0–15.0)
MCH: 30.9 pg (ref 26.0–34.0)
MCHC: 33.6 g/dL (ref 30.0–36.0)
MCV: 92 fL (ref 78.0–100.0)
PLATELETS: 235 10*3/uL (ref 150–400)
RBC: 3.14 MIL/uL — ABNORMAL LOW (ref 3.87–5.11)
RDW: 12.7 % (ref 11.5–15.5)
WBC: 6.6 10*3/uL (ref 4.0–10.5)

## 2013-12-30 LAB — BASIC METABOLIC PANEL
BUN: 13 mg/dL (ref 6–23)
CALCIUM: 8.4 mg/dL (ref 8.4–10.5)
CO2: 32 mEq/L (ref 19–32)
CREATININE: 0.46 mg/dL — AB (ref 0.50–1.10)
Chloride: 94 mEq/L — ABNORMAL LOW (ref 96–112)
GFR calc Af Amer: >90 mL/min (ref >90)
Glucose, Bld: 113 mg/dL — ABNORMAL HIGH (ref 70–99)
Potassium: 3.8 mEq/L (ref 3.7–5.3)
Sodium: 134 mEq/L — ABNORMAL LOW (ref 137–147)

## 2013-12-30 MED ORDER — HYDROCODONE-ACETAMINOPHEN 5-325 MG PO TABS: 1.0000 | ORAL_TABLET | Freq: Four times a day (QID) | ORAL | Status: DC | PRN

## 2013-12-30 MED ORDER — DIAZEPAM 5 MG PO TABS: 5.0000 mg | ORAL_TABLET | Freq: Every day | ORAL | Status: DC | PRN

## 2013-12-30 MED ORDER — IPRATROPIUM-ALBUTEROL 18-103 MCG/ACT IN AERO: 2.0000 | INHALATION_SPRAY | RESPIRATORY_TRACT | Status: AC | PRN

## 2013-12-30 MED ORDER — NICOTINE 14 MG/24HR TD PT24: 14.0000 mg | MEDICATED_PATCH | Freq: Every day | TRANSDERMAL | Status: DC

## 2013-12-30 MED ORDER — ENOXAPARIN SODIUM 40 MG/0.4ML ~~LOC~~ SOLN: 40.0000 mg | SUBCUTANEOUS | Status: DC

## 2013-12-30 MED ORDER — POTASSIUM CHLORIDE ER 10 MEQ PO TBCR: 10.0000 meq | EXTENDED_RELEASE_TABLET | Freq: Every day | ORAL | Status: AC

## 2013-12-30 NOTE — Clinical Social Work Note (Signed)
[facility] for [facility] today [facility]. Family and patient agreeable to this plan- plan transfer via EMS. Reece LevyJanet Welby Montminy, MSW, Theresia MajorsLCSWA [phone]

## 2013-12-30 NOTE — Progress Notes (Signed)
Subjective: 4 Days Post-Op Procedure(s) (LRB): ARTHROPLASTY BIPOLAR HIP LEFT (Left) Patient reports pain as 2 on 0-10 scale.    Objective: Vital signs in last 24 hours: Temp:  [98.4 F (36.9 C)-99.4 F (37.4 C)] 98.7 F (37.1 C) (04/29 0630) Pulse Rate:  [85-90] 85 (04/29 0630) Resp:  [18-20] 20 (04/29 0630) BP: (134-174)/(47-84) 168/79 mmHg (04/29 0630) SpO2:  [96 %-98 %] 98 % (04/29 0630)  Intake/Output from previous day: 04/28 0701 - 04/29 0700 In: 840 [P.O.:840] Out: 1550 [Urine:1550] Intake/Output this shift:     Recent Labs  12/28/13 0448 12/29/13 0444 12/30/13 0445  HGB 9.9* 9.7* 9.7*    Recent Labs  12/29/13 0444 12/30/13 0445  WBC 6.7 6.6  RBC 3.09* 3.14*  HCT 28.6* 28.9*  PLT 198 235    Recent Labs  12/29/13 0444 12/30/13 0445  NA 134* 134*  K 4.3 3.8  CL 96 94*  CO2 30 32  BUN 16 13  CREATININE 0.55 0.46*  GLUCOSE 113* 113*  CALCIUM 8.2* 8.4   No results found for this basename: LABPT, INR,  in the last 72 hours  Neurologically intact Neurovascular intact Sensation intact distally Intact pulses distally Dorsiflexion/Plantar flexion intact Incision: no drainage  She has done well in PT.  She will be discharged to SNF today.  Assessment/Plan: 4 Days Post-Op Procedure(s) (LRB): ARTHROPLASTY BIPOLAR HIP LEFT (Left) Discharge to SNF  She will need to keep abduction pillow between knees while at SNF for one month in addition to the other items I listed yesterday.  Darreld McleanWayne October Peery 12/30/2013, 7:56 AM

## 2013-12-30 NOTE — Clinical Social Work Placement (Signed)
Clinical Social Work Department CLINICAL SOCIAL WORK PLACEMENT NOTE 12/30/2013  Patient:  Whitney [name],Whitney [name]  Account Number:  [id number] Admit date:  12/25/2013  Clinical Social Worker:  Robin SearingJANET Kolin Erdahl, LCSWA  Date/time:  12/28/2013 11:10 AM  Clinical Social Work is seeking post-discharge placement for this patient at the following level of care:   SKILLED NURSING   (*CSW will update this form in Epic as items are completed)   12/28/2013  [facility]family provided with [facility] of Clinical Social Work'[name] list of facilities offering this level of care within the geographic area requested by the patient (or if unable, by the patient'[name] family).  12/28/2013  Patient/family informed of their freedom to choose among providers that offer the needed level of care, that participate in Medicare, Medicaid or managed care program needed by the patient, have an available bed and are willing to accept the patient.  12/28/2013  Patient/family informed of [facility]' ownership interest in [facility], as well as of the fact that they are under no obligation to receive care at this facility.  PASARR submitted to EDS on 12/28/2013 PASARR number received from EDS on 12/28/2013  FL2 transmitted to all facilities in geographic area requested by pt/family on  12/28/2013 FL2 transmitted to all facilities within larger geographic area on   Patient informed that his/her managed care company has contracts with or will negotiate with  certain facilities, including the following:     Patient/family informed of bed offers received:  12/28/2013 [facility] Physician recommends and patient chooses bed at    [facility]  12/30/2013 Patient to be transferred to facility by RN  The following physician request were entered in Epic:   Additional Comments: Reece LevyJanet Harvey Lingo, MSW, Theresia MajorsLCSWA [phone]

## 2013-12-30 NOTE — Discharge Summary (Signed)
Physician Discharge Summary  Whitney PatientRebecca S [name] ZOX:[id number] DOB: April 24, 1930 DOA: 12/25/2013  PCP: Colette RibasGOLDING, JOHN CABOT, MD  Admit date: 12/25/2013 Discharge date: 12/30/2013  Time spent: Greater than 30 minutes  Recommendations for Outpatient Follow-up:  1. Whitney [name] will need to have sutures removed and Steri-Strip wound on Jan 05, 2014. She will need to be on Lovenox daily for one month following surgery, then it should be discontinued. She is to use a walker and have toe touch on Whitney left with physical therapy. Dr. Hilda LiasKeeling would need to see her in his office in 1 month. X-rays of Whitney left hip should be done prior to Whitney appointment if she is still at [facility] skilled nursing facility. 2. Consider oxygen supplementation with activity if her oxygen saturation decreases. 3. Discharge to [facility].  Discharge Diagnoses:  1. Acute fracture of Whitney left femoral neck; status post bipolar hip prosthesis on 12/26/13 by Dr. Hilda LiasKeeling. 2. Tobacco abuse. Whitney [name] was strongly advised to stop smoking. 3. Acute blood loss anemia. 4. Acute respiratory failure with hypoxia secondary to a combination of mild pulmonary edema from volume resuscitation and COPD. Resolved. Oxygen saturation 92% on room air at rest (oxygen saturation on room air with activity not performed at Whitney time of discharge). 5. Hypertension.  Discharge Condition: Improved.  Diet recommendation: Heart healthy.  Filed Weights   12/25/13 1641 12/25/13 2102  Weight: 55.339 kg (122 lb) 54.8 kg (120 lb 13 oz)    History of present illness:  Whitney [name] is an 80 year old woman with a past medical history of hypertension and hyperlipidemia, who presented to [facility] emergency department on 12/25/2013 after falling. She landed on her left side. She denied loss of consciousness or dizziness. She complained of intense left hip/leg pain. In Whitney ED, she was afebrile and hemodynamically stable. X-ray of her left hip revealed impacted transcervical  left femoral neck fracture with marked foreshortening of Whitney femoral neck. She was admitted for further evaluation and management.  Hospital Course:   [name] without any immediate complications. Postoperatively, she did very well from a functional standpoint. She did develop some significant hypoxia requiring supplemental oxygen. Chest x-ray did indicate signs of volume overload, likely related to IV fluid administration. She was given low-dose Lasix. Hydrochlorothiazide was resumed upon discharge. She still may require oxygen with ambulation, but her oxygen saturation was 92% on room air at rest prior to discharge.   Left hip fracture.  Status post operative repair by Dr. Hilda LiasKeeling on 12/25/13. His postoperative instructions and followup were dictated above in Whitney "Outpatient for Followup Recommendations" section. All were in agreement for her discharge to skilled nursing facility for ongoing rehabilitation. She will be discharged to [facility] today.  Hypertension.  Her blood pressure was more less stable. She was continued on clonidine, Benicar and metoprolol although hydrochlorothiazide was withheld. It was restarted upon discharge. History of hyperlipidemia.  She was maintained on statin therapy and omega-3 fatty acids.  Tobacco abuse.  She was counseled on Whitney importance of tobacco cessation. A nicotine patch was placed. Acute respiratory failure. Whitney [name] desaturated down to Whitney 80s with rest and activity. IV fluids were discontinued as Whitney chest x-ray revealed mild volume overload from IV fluids she received for Whitney first 48 hours of Whitney hospitalization and in Whitney setting of holding her hydrochlorothiazide. She may have underlying COPD  and may chronically have low-normal oxygen saturations at baseline. Nevertheless, her oxygen saturation improved to 92%  on room air at rest at Whitney time of discharge. She will need to have her oxygen saturation monitored with activity at Whitney skilled nursing facility. Combivent inhaler can be given when necessary. Acute blood loss anemia Her hemoglobin fell gradually from 13.9 to 9.7 at Whitney time of discharge. Over Whitney 48 hours prior to hospital discharge, her hemoglobin was stable at 9.7-9.9. Whitney anemia was secondary to blood loss from Whitney surgery and from Whitney dilutional effects of IV fluids.     Procedures: PROCEDURE: Bipolar hip prostheses using a [employer], size 11  stem, +4 neck, 47 mm head.   Consultations:  Darreld Mclean, M.D. and an in and and  Discharge Exam: Filed Vitals:   12/30/13 0630  BP: 168/79  Pulse: 85  Temp: 98.7 F (37.1 C)  Resp: 20    General: [name] is sitting up in a chair, in no acute distress. Cardiovascular: S1, S2, no murmurs rubs gallops. Respiratory: Lungs clear to auscultation bilaterally with decreased breath sounds in Whitney bases. Extremities/musculoskeletal: Trace of left lower extremity edema and no edema on Whitney right.  Discharge Instructions You were cared for by a hospitalist during your hospital stay. If you have any questions about your discharge medications or Whitney care you received while you were in Whitney hospital after you are discharged, you can call Whitney unit and asked to speak with Whitney hospitalist on call if Whitney hospitalist that took care of you is not available. Once you are discharged, your primary care physician will handle any further medical issues. Please note that NO REFILLS for any discharge medications will be authorized once you are discharged, as it is imperative that you return to your primary care physician (or establish a relationship with a primary care physician if you do not have one) for your aftercare needs so that they can reassess your need for medications and monitor your lab values.      Discharge Orders   Future Orders Complete By  Expires   Diet - low sodium heart healthy  As directed    Discharge instructions  As directed    Increase activity slowly  As directed        Medication List         albuterol-ipratropium 18-103 MCG/ACT inhaler  Commonly known as:  COMBIVENT  Inhale 2 puffs into Whitney lungs every 4 (four) hours as needed for wheezing or shortness of breath.     aspirin EC 81 MG tablet  Take 81 mg by mouth every morning.     atorvastatin 20 MG tablet  Commonly known as:  LIPITOR  Take 10 mg by mouth at bedtime.     BENICAR 40 MG tablet  Generic drug:  olmesartan  Take 40 mg by mouth daily.     CALCIUM 500 +D 500-400 MG-UNIT Tabs  Generic drug:  Calcium Carb-Cholecalciferol  Take 1 tablet by mouth at bedtime.     cloNIDine 0.1 MG tablet  Commonly known as:  CATAPRES  Take 0.1-0.2 mg by mouth 2 (two) times daily. Two tablets taken in Whitney morning and one tablet at super     diazepam 5 MG tablet  Commonly known as:  VALIUM  Take 1 tablet (5 mg total) by mouth daily as needed for anxiety.     doxazosin 2 MG tablet  Commonly known as:  CARDURA  Take 2 mg by mouth at  bedtime.     enoxaparin 40 MG/0.4ML injection  Commonly known as:  LOVENOX  Inject 0.4 mLs (40 mg total) into Whitney skin daily. Continue until 01/24/2014.     Fish Oil 1000 MG Caps  Take 1,000 mg by mouth at bedtime.     hydrochlorothiazide 25 MG tablet  Commonly known as:  HYDRODIURIL  Take 25 mg by mouth every morning.     HYDROcodone-acetaminophen 5-325 MG per tablet  Commonly known as:  NORCO/VICODIN  Take 1-2 tablets by mouth every 6 (six) hours as needed for moderate pain.     metoprolol succinate 100 MG 24 hr tablet  Commonly known as:  TOPROL-XL  Take 100 mg by mouth every morning.     nicotine 14 mg/24hr patch  Commonly known as:  NICODERM CQ - dosed in mg/24 hours  Place 1 patch (14 mg total) onto Whitney skin daily.     potassium chloride 10 MEQ tablet  Commonly known as:  K-DUR  Take 1 tablet (10 mEq total) by  mouth daily.       Allergies  Allergen Reactions  . Prednisone Other (See Comments)    Increased Blood pressure levels   Follow-up Information   Follow up with Darreld Mclean, MD In 1 month.   Specialty:  Orthopedic Surgery   Contact information:   [address] [place] [address] [phone]       Follow up with [facility]. Schedule an appointment as soon as possible for a visit in 1 month. (Xray of Left hip prior to appointment with Dr. Hilda Lias)        Whitney results of significant diagnostics from this hospitalization (including imaging, microbiology, ancillary and laboratory) are listed below for reference.    Significant Diagnostic Studies: Dg Hip Complete Left  12/25/2013   CLINICAL DATA:  Fall, left hip pain  EXAM: LEFT HIP - COMPLETE 2+ VIEW  COMPARISON:  None.  FINDINGS: Impacted transcervical left femoral neck fracture. There is marked foreshortening of Whitney femoral neck. Whitney femoral head component remains located within Whitney acetabulum. Whitney bony pelvis appears intact. Whitney visualized proximal right femur is unremarkable. Degenerative disc disease and levoconvex scoliosis incompletely seen in Whitney low lumbar spine. Normal bony mineralization for age. No discrete osseous lesion.  IMPRESSION: Impacted transcervical left femoral neck fracture with marked foreshortening of Whitney femoral neck.   Electronically Signed   By: Malachy Moan M.D.   On: 12/25/2013 17:48   Dg Chest Port 1 View  12/28/2013   CLINICAL DATA:  Cough, congestion and decreased oxygen saturations. Recent total hip replacement.  EXAM: PORTABLE CHEST - 1 VIEW  COMPARISON:  Chest x-ray 12/25/2013.  FINDINGS: Mild emphysematous changes are again noted. Lungs appear hypoventilatory. Bibasilar opacities may reflect areas of atelectasis and/or consolidation, with superimposed small to moderate right and small left pleural effusions. There is cephalization of Whitney pulmonary vasculature and slight  indistinctness of Whitney interstitial markings suggestive of mild pulmonary edema. Heart size appears borderline enlarged, likely accentuated in part by [name]'s rotation to Whitney left which helps with distorts upper mediastinal contours. Atherosclerosis in Whitney thoracic aorta.  IMPRESSION: 1. Whitney appearance of Whitney chest suggests mild congestive heart failure, as above. 2. Bibasilar opacities are most likely related to postoperative hypoventilation and atelectasis, although underlying airspace consolidation from developing infection or sequela of recent aspiration is not excluded. 3. Atherosclerosis.   Electronically Signed   By: Trudie Reed M.D.   On: 12/28/2013 17:24   Dg Chest Portable  1 View  12/25/2013   CLINICAL DATA:  Fall, left hip pain, preoperative chest x-ray  EXAM: PORTABLE CHEST - 1 VIEW  COMPARISON:  Prior chest x-ray 12/27/2011  FINDINGS: Stable cardiac and mediastinal contours. Persistent mild cardiomegaly. Atherosclerotic and tortuous thoracic aorta. Central bronchitic changes and interstitial prominence are similar compared to prior. No over pulmonary edema, pleural effusion, pneumothorax or suspicious nodular mass. No focal airspace consolidation. No acute osseous abnormality.  IMPRESSION: No active disease.  Chronic bronchitic changes and interstitial prominence.  Tortuous and atherosclerotic thoracic aorta.  Borderline cardiomegaly.   Electronically Signed   By: Malachy Moan M.D.   On: 12/25/2013 17:46   Dg Hip Portable 1 View Left  12/26/2013   CLINICAL DATA:  Postop hip radiograph  EXAM: PORTABLE LEFT HIP - 1 VIEW  COMPARISON:  DG HIP COMPLETE*L* dated 12/25/2013  FINDINGS: Single portable AP projection of radiographic image of Whitney left hip demonstrates Whitney sequela of left bipolar hip replacement. Alignment appears anatomic on Whitney solitary AP projection radiograph. No definite fracture. There is expected subcutaneous emphysema about Whitney operative site. No definite radiopaque foreign  body.  IMPRESSION: Post bipolar left hip replacement without evidence of complication.   Electronically Signed   By: Simonne Come M.D.   On: 12/26/2013 09:36    Microbiology: Recent Results (from Whitney past 240 hour(s))  SURGICAL PCR SCREEN     Status: None   Collection Time    12/26/13  1:07 AM      Result Value Ref Range Status   MRSA, PCR NEGATIVE  NEGATIVE Final   Staphylococcus aureus NEGATIVE  NEGATIVE Final   Comment:            Whitney Xpert SA Assay (FDA     approved for NASAL specimens     in patients over 49 years of age),     is one component of     a comprehensive surveillance     program.  Test performance has     been validated by [facility] [employer] for patients greater     than or equal to 1 year old.     It is not intended     to diagnose infection nor to     guide or monitor treatment.  WOUND CULTURE     Status: None   Collection Time    12/26/13  8:45 AM      Result Value Ref Range Status   Specimen Description WOUND LEFT HIP   Final   Special Requests NONE   Final   Gram Stain     Final   Value: NO WBC SEEN     NO SQUAMOUS EPITHELIAL CELLS SEEN     NO ORGANISMS SEEN     Performed at [facility]   Culture     Final   Value: NO GROWTH 2 DAYS     Performed at [facility]   Report Status 12/29/2013 FINAL   Final  URINE CULTURE     Status: None   Collection Time    12/28/13 10:20 AM      Result Value Ref Range Status   Specimen Description URINE, CATHETERIZED   Final   Special Requests NONE   Final   Culture  Setup Time     Final   Value: 12/28/2013 13:50     Performed at [facility] Count     Final   Value: NO GROWTH  Performed at [facility]   Culture     Final   Value: NO GROWTH     Performed at [facility]   Report Status 12/29/2013 FINAL   Final     Labs: Basic Metabolic Panel:  Recent Labs Lab 12/26/13 0527 12/27/13 0544 12/28/13 0448 12/29/13 0444 12/30/13 0445  NA 135* 132* 133*  134* 134*  K 4.2 4.0 4.2 4.3 3.8  CL 95* 94* 96 96 94*  CO2 32 31 31 30  32  GLUCOSE 115* 113* 131* 113* 113*  BUN 14 12 14 16 13   CREATININE 0.61 0.55 0.59 0.55 0.46*  CALCIUM 8.8 8.4 8.3* 8.2* 8.4   Liver Function Tests:  Recent Labs Lab 12/25/13 1721  AST 22  ALT 15  ALKPHOS 74  BILITOT 0.6  PROT 7.4  ALBUMIN 3.9   No results found for this basename: LIPASE, AMYLASE,  in Whitney last 168 hours No results found for this basename: AMMONIA,  in Whitney last 168 hours CBC:  Recent Labs Lab 12/25/13 1721 12/26/13 0527 12/27/13 0544 12/28/13 0448 12/29/13 0444 12/30/13 0445  WBC 10.3 7.6 8.3 8.4 6.7 6.6  NEUTROABS 8.2*  --  6.2 6.3 4.8  --   HGB 13.9 12.7 11.2* 9.9* 9.7* 9.7*  HCT 39.1 38.2 33.4* 29.4* 28.6* 28.9*  MCV 90.7 92.5 93.0 92.7 92.6 92.0  PLT 235 236 191 166 198 235   Cardiac Enzymes: No results found for this basename: CKTOTAL, CKMB, CKMBINDEX, TROPONINI,  in Whitney last 168 hours BNP: BNP (last 3 results) No results found for this basename: PROBNP,  in Whitney last 8760 hours CBG: No results found for this basename: GLUCAP,  in Whitney last 168 hours     Signed:  Elliot CousinDenise Jakiah Bienaime  [facility] 12/30/2013, 12:30 PM

## 2013-12-31 ENCOUNTER — Other Ambulatory Visit: Payer: Self-pay

## 2013-12-31 MED ORDER — DIAZEPAM 5 MG PO TABS: 5.0000 mg | ORAL_TABLET | Freq: Every day | ORAL | Status: AC | PRN

## 2013-12-31 NOTE — Progress Notes (Signed)
Discharged to [facility], reported to nurse, transported via w/c in stable condition with staff.

## 2013-12-31 NOTE — Telephone Encounter (Signed)
RX faxed to [facility] @ [phone]. Phone number [phone]  

## 2014-01-04 ENCOUNTER — Non-Acute Institutional Stay (SKILLED_NURSING_FACILITY): Payer: Medicare Other | Admitting: Internal Medicine

## 2014-01-04 DIAGNOSIS — S72142A Displaced intertrochanteric fracture of left femur, initial encounter for closed fracture: Secondary | ICD-10-CM

## 2014-01-04 DIAGNOSIS — J4489 Other specified chronic obstructive pulmonary disease: Secondary | ICD-10-CM

## 2014-01-04 DIAGNOSIS — I509 Heart failure, unspecified: Secondary | ICD-10-CM

## 2014-01-04 DIAGNOSIS — S72143A Displaced intertrochanteric fracture of unspecified femur, initial encounter for closed fracture: Secondary | ICD-10-CM

## 2014-01-04 DIAGNOSIS — M810 Age-related osteoporosis without current pathological fracture: Secondary | ICD-10-CM

## 2014-01-04 DIAGNOSIS — I10 Essential (primary) hypertension: Secondary | ICD-10-CM | POA: Insufficient documentation

## 2014-01-04 DIAGNOSIS — J449 Chronic obstructive pulmonary disease, unspecified: Secondary | ICD-10-CM

## 2014-01-04 NOTE — Progress Notes (Signed)
Patient ID: Whitney [name], female   DOB: 11-05-1929, 80 y.o.   MRN: [id number]  Facility; [facility] Chief complaint; admission to SNF [facility] from 4/24 to 4/29-  History; this is an 80 year old woman who lives on her own outside of [place]. She is followed by cardiology for hypertension and hyperlipidemia. She fell in her yard although she does not have a history of falling. She landed on her left side and complained of pain. Exit ray of her left hip revealed an impacted transcervical left femoral neck fracture. She had a bipolar hip prosthesis applied. In the postoperative period she developed significant hypoxemia requiring supplemental oxygen. A chest x-ray did show signs of volume overload likely related to IV fluid administration. She was given low-dose Lasix and her hydrochlorothiazide was resumed at discharge. She is on oxygen however her O2 sat was 92% on room air at rest prior to discharge.   The patient was a smoker prior to admission although she does not describe respiratory limitation. She was not on oxygen prior to admission. She was felt to have possible underlying COPD in the hospital. She also has known osteoporosis based on a bone density test in 2013 over she does not describe being on medications for this at side of calcium and vitamin D. She has no prior fall history  Past Medical History  Diagnosis Date  . Hypertension   . Hyperlipidemia   . Tobacco abuse    Past Surgical History  Procedure Laterality Date  . Hip arthroplasty Left 12/26/2013    Procedure: ARTHROPLASTY BIPOLAR HIP LEFT;  Surgeon: Darreld McleanWayne Keeling, MD;  Location: [facility];  Service: Orthopedics;  Laterality: Left;   Current Outpatient Prescriptions on File Prior to Visit  Medication Sig Dispense Refill  . albuterol-ipratropium (COMBIVENT) 18-103 MCG/ACT inhaler Inhale 2 puffs into the lungs every 4 (four) hours as needed for wheezing or shortness of breath.      [name]. aspirin EC 81 MG tablet  Take 81 mg by mouth every morning.       [name]. atorvastatin (LIPITOR) 20 MG tablet Take 10 mg by mouth at bedtime.       [name]. BENICAR 40 MG tablet Take 40 mg by mouth daily.      . Calcium Carb-Cholecalciferol (CALCIUM 500 +D) 500-400 MG-UNIT TABS Take 1 tablet by mouth at bedtime.       . cloNIDine (CATAPRES) 0.1 MG tablet Take 0.1-0.2 mg by mouth 2 (two) times daily. Two tablets taken in the morning and one tablet at super      . diazepam (VALIUM) 5 MG tablet Take 1 tablet (5 mg total) by mouth daily as needed for anxiety.  30 tablet  5  . doxazosin (CARDURA) 2 MG tablet Take 2 mg by mouth at bedtime.      . enoxaparin (LOVENOX) 40 MG/0.4ML injection Inject 0.4 mLs (40 mg total) into the skin daily. Continue until 01/24/2014.  0 Syringe    . hydrochlorothiazide (HYDRODIURIL) 25 MG tablet Take 25 mg by mouth every morning.       [name]. HYDROcodone-acetaminophen (NORCO/VICODIN) 5-325 MG per tablet Take 1-2 tablets by mouth every 6 (six) hours as needed for moderate pain.  30 tablet  0  . metoprolol succinate (TOPROL-XL) 100 MG 24 hr tablet Take 100 mg by mouth every morning.       . nicotine (NICODERM CQ - DOSED IN MG/24 HOURS) 14 mg/24hr patch Place 1 patch (14 mg total) onto the skin daily.      .[name]  Omega-3 Fatty Acids (FISH OIL) 1000 MG CAPS Take 1,000 mg by mouth at bedtime.       . potassium chloride (K-DUR) 10 MEQ tablet Take 1 tablet (10 mEq total) by mouth daily.       No current facility-administered medications on file prior to visit.   Social history; patient lives in her on home. Describes herself as functionally independent in ADLs and IADLs. She does not use an ambulatory assist device she is not on oxygen.  reports that she has been smoking Cigarettes.  She has a 50 pack-year smoking history. She does not have any smokeless tobacco history on file. She reports that she does not drink alcohol or use illicit drugs.  family history includes Diabetes in her sister.  Review of systems; Respiratory;  she does not describe cough or shortness of breath Card; no exertional chest pain although she apparently had some form of stress test in the past. Follows with cardiology for hypertension and hyperlipidemia Abdomen no nausea vomiting or abdominal pain GU; no prior voiding difficulty although she complained vehemently of having to void when I was in the room and her bladder seemed well above her umbilicus. Extremities; complaining of edema in the left leg  Physical exam; Gen. the patient appears to be in no distress. Oxygen on. Alert and conversational Vitals; O2 sat is 94% on 2 L; respirations 18; pulse rate 71; blood pressure 192/92 HEENT oral exam is normal Respiratory; somewhat barrel chested. Air entry slightly reduced no wheezing work of breathing appears normal at rest Cardiac; heart sounds are normal no gallops or murmurs Abdomen; slightly distended; no tenderness no liver no spleen GU; she was distended when I first came in around after she voided however her post void residual urine was only 74 cc Extremities; trivial edema in the left leg there is no evidence of a DVT on either side. Probably some degree of chronic venous insufficiency and venous stasis dermatitis  Impression/plan #1 status post left hip replacement the. All change the Lovenox to xarelto #2 CHF in the hospital felt to be secondary to fluid volume overload. I reviewed the chest x-ray which indeed does look like interstitial edema on top of emphysema. She was diuresis and now is back on hydrochlorothiazide 25 mg a day #3 probable COPD although the patient does not describe any limitation. She probably could use at least pulmonary function tests when she is better and more mobile #4 probable significant osteoporosis. She is on calcium plus vitamin D. We'll look to send her home on a biphosphonate #5 hypertension. This was followed by cardiology. I think the discomfort if her voiding perhaps pain his push this up. I will  monitor this and make adjustments accordingly #6 hyperlipidemia; although I don't see that she is on a statin she is on fish oil #7 anxiety she is on Valium which of course is not a good medication for old people who've fallen

## 2014-01-14 ENCOUNTER — Other Ambulatory Visit: Payer: Self-pay

## 2014-01-14 MED ORDER — HYDROCODONE-ACETAMINOPHEN 5-325 MG PO TABS: ORAL_TABLET | ORAL | Status: DC

## 2014-01-14 NOTE — Telephone Encounter (Signed)
RX faxed to [facility] @ [phone]. Phone number [phone]  

## 2014-01-18 ENCOUNTER — Ambulatory Visit (HOSPITAL_COMMUNITY): Payer: Medicare Other | Attending: Orthopaedic Surgery

## 2014-02-04 ENCOUNTER — Encounter: Payer: Self-pay | Admitting: Internal Medicine

## 2014-02-04 ENCOUNTER — Non-Acute Institutional Stay (SKILLED_NURSING_FACILITY): Payer: Medicare Other | Admitting: Internal Medicine

## 2014-02-04 DIAGNOSIS — S72002A Fracture of unspecified part of neck of left femur, initial encounter for closed fracture: Secondary | ICD-10-CM

## 2014-02-04 DIAGNOSIS — D62 Acute posthemorrhagic anemia: Secondary | ICD-10-CM

## 2014-02-04 DIAGNOSIS — E785 Hyperlipidemia, unspecified: Secondary | ICD-10-CM

## 2014-02-04 DIAGNOSIS — I10 Essential (primary) hypertension: Secondary | ICD-10-CM

## 2014-02-04 DIAGNOSIS — S72009A Fracture of unspecified part of neck of unspecified femur, initial encounter for closed fracture: Secondary | ICD-10-CM

## 2014-02-04 DIAGNOSIS — M810 Age-related osteoporosis without current pathological fracture: Secondary | ICD-10-CM

## 2014-02-04 NOTE — Progress Notes (Signed)
Patient ID: Whitney [name], female   DOB: Aug 14, 1930, 80 y.o.   MRN: [id number]   Facility; [facility]  Chief complaint;-Discharge note   History; this is an 80 year old woman who lives on her own outside of [place]. She is followed by cardiology for hypertension and hyperlipidemia. She fell in her yard although she does not have a history of falling. She landed on her left side and complained of pain. X ray of her left hip revealed an impacted transcervical left femoral neck fracture. She had a bipolar hip prosthesis applied. In the postoperative period she developed significant hypoxemia requiring supplemental oxygen. A chest x-ray did show signs of volume overload likely related to IV fluid administration. She was given low-dose Lasix and her hydrochlorothiazide was resumed at discharge.  This has been stable during her stay here she is no longer on oxygen has no complaints of shortness of breath.     She was felt to have possible underlying COPD in the hospital. She also has known osteoporosis based on a bone density test in 2013 over she does not describe being on medications for this other than calcium and vitamin D. She has no prior fall history  She will be home alone but does have family very closely by-she is ambulating fairly well with a cane-she will need PT OT and RN support for follow up of her medical issues as well as further strengthening status post her fracture with repair.  Pain is controlled on Norco she does not take this  Often--- apparently at night and as needed  She does have a history of long-term hypertension her systolics appear variable ranging from the 130s-180s-I got 150 systolically on exam-this will need  Follow up by her primary care provider she is on numerous medications including clonidine 0.1 twice a day-Cardura 2 mg each bedtime-and hydrochlorothiazide 25 mg in the morning-Toprol-XL 100 mg daily as well as Benicar 40 mg a day  Patient is looking  forward to going home she was given the option of  going home after surgery but opted for short-term rehabilitation which she has completed here--she will need PT and OT as well as RN support for follow up of her medical issues.    Past Medical History   Diagnosis  Date   .  Hypertension     .  Hyperlipidemia     .  Tobacco abuse         Past Surgical History   Procedure  Laterality  Date   .  Hip arthroplasty  Left  12/26/2013       Procedure: ARTHROPLASTY BIPOLAR HIP LEFT;  Surgeon: Darreld Mclean, MD;  Location: [facility];  Service: Orthopedics;  Laterality: Left;       Current Outpatient Prescriptions on File Prior to Visit   Medication  Sig  Dispense  Refill   .  albuterol-ipratropium (COMBIVENT) 18-103 MCG/ACT inhaler  Inhale 2 puffs into the lungs every 4 (four) hours as needed for wheezing or shortness of breath.         [name]  aspirin EC 81 MG tablet  Take 81 mg by mouth every morning.          [name]  atorvastatin (LIPITOR) 20 MG tablet  Take 10 mg by mouth at bedtime.          [name]  BENICAR 40 MG tablet  Take 40 mg by mouth daily.         .  Calcium Carb-Cholecalciferol (CALCIUM 500 +D)  500-400 MG-UNIT TABS  Take 1 tablet by mouth at bedtime.          .  cloNIDine (CATAPRES) 0.1 MG tablet  Take 0.1-0.2 mg by mouth 2 (two) times daily. Two tablets taken in the morning and one tablet at super         .  diazepam (VALIUM) 5 MG tablet  Take 1 tablet (5 mg total) by mouth daily as needed for anxiety.   30 tablet   5   .  doxazosin (CARDURA) 2 MG tablet  Take 2 mg by mouth at bedtime.                .  hydrochlorothiazide (HYDRODIURIL) 25 MG tablet  Take 25 mg by mouth every morning.          [name]  HYDROcodone-acetaminophen (NORCO/VICODIN) 5-325 MG per tablet  Take 1-2 tablets by mouth every 6 (six) hours as needed for moderate pain.   30 tablet   0   .  metoprolol succinate (TOPROL-XL) 100 MG 24 hr tablet  Take 100 mg by mouth every morning.          .  nicotine (NICODERM CQ - DOSED IN MG/24 HOURS)  14 mg/24hr patch  Place 1 patch (14 mg total) onto the skin daily.         .  Omega-3 Fatty Acids (FISH OIL) 1000 MG CAPS  Take 1,000 mg by mouth at bedtime.          .  potassium chloride (K-DUR) 10 MEQ tablet  Take 1 tablet (10 mEq total) by mouth daily.           .      Social history; patient lives in her on home. Describes herself as functionally independent in ADLs and IADLs..  reports that she has been smoking Cigarettes.  She has a 50 pack-year smoking history. She does not have any smokeless tobacco history on file. She reports that she does not drink alcohol or use illicit drugs.--Is now on a nicotine patch   family history includes Diabetes in her sister.   Review of systems Gen. no complaints of fever or chills.  Skin does not complaining of any rashes or itching surgical site appears well-healed on left hip.  Eyes has prescription lenses does not complaining of visual changes or eye discomfort; Respiratory; she does not describe cough or shortness of breath Card; no exertional chest pain although she apparently had some form of stress test in the past. Follows with cardiology for hypertension and hyperlipidemia Abdomen no nausea vomiting or abdominal pain GU; does not complain of dysuria . Extremities;- Does not complaining of persistent joint pain apparently takes the Norco with relief as needed  Neurologic-no complaints of headache or dizziness.  Psych is not complaining of depression or anxiety although apparently she does take Valium at night--has been taking this long term   Physical exam; Temperature 98.1 pulse 70 respirations 20 weight is 122.7 which appears to be relatively stable recent weights  In general this is a pleasant elderly female in no distress sitting comfortably in a wheelchair.  Her skin is warm and dry.  Eyes pupils appear reactive to light extraocular movements intact sclerae and conjunctivae are clear visual acuity appears  intact.  Oropharynx clear mucous membranes moist.     HEENT oral exam is normal Respiratory; somewhat barrel chested. Air entry slightly reduced no wheezing work of breathing appears normal at rest  Cardiac; heart sounds are  normal no gallops or murmurs irregular rate and rhythm  Abdomen; slightly distended; positive bowel sounds nontender to palpation   Extremities Does not appear to have significant lower extremity edema pedal pulses intact t--t she is able to ambulate with a cane and does fairly well with this-surgical site left hip appears well-healed Strength appears intact all 4 extremities.  Neurologic-no lateralizing findings cranial nerves intact her speech is clear.  Psych she is alert and oriented pleasant and appropriate  LABS 01/06/2014.  WBC 7.4 hemoglobin 11.6 platelets 5:15.  Sodium 132 potassium 4.4 BUN 11 creatinine 0.52.      Impression/plan #1 status post left hip replacement --appears to be a fairly unremarkable recovery-she will need continued PT and OT support as well as RN  #2 CHF in the hospital felt to be secondary to fluid volume overload.--This has been stable no significant edema her weight stable no signs at this point persistent CHF--no complaints of shortness  of breath or hypoxia She is on hydrochlorothiazide-Will update about  metabolic panel before discharge I do note she is on some potassium supplementation as well  #3 probable COPD although the patient does not describe any limitation. At some point probably would benefit from pulmonary function tests will defer to primary care provider  #4 probable significant osteoporosis. She is on calcium plus vitamin D.-- #5 hypertension. This was followed by cardiology.--Continues on numerous medications as noted above at this point we'll defer to cardiology apparently this has been a long-term situation  #6 hyperlipidemia; although I don't see that she is on a statin she is on fish oil--we'll  defer followup to primary care provider -since her stay here has been relatively short-I do note liver function tests were unremarkable in the hospital #7 anxiety she is on Valium --apparently has been on this long term - #8 anemia suspect chronic versus postop Will update this before discharge-  She will need PT and OT therapy for continued strengthening status post left hip repair-as well as RN support for her numerous medical issues including followup hypertension  [id number]-of note greater than 30 minutes spent preparing this discharge summary

## 2014-04-07 ENCOUNTER — Other Ambulatory Visit (HOSPITAL_COMMUNITY): Payer: Self-pay | Admitting: Physician Assistant

## 2014-04-07 DIAGNOSIS — Z780 Asymptomatic menopausal state: Secondary | ICD-10-CM

## 2014-04-13 ENCOUNTER — Ambulatory Visit (HOSPITAL_COMMUNITY)
Admission: RE | Admit: 2014-04-13 | Discharge: 2014-04-13 | Disposition: A | Discharge status: Home / Self Care | Payer: Medicare Other | Source: Ambulatory Visit | Attending: Physician Assistant | Admitting: Physician Assistant

## 2014-04-13 DIAGNOSIS — Z780 Asymptomatic menopausal state: Secondary | ICD-10-CM | POA: Diagnosis present

## 2014-08-04 ENCOUNTER — Telehealth: Payer: Self-pay | Admitting: Cardiovascular Disease

## 2014-08-04 NOTE — Telephone Encounter (Signed)
Pt would like some samples of Benicar 40 mg please.

## 2014-08-04 NOTE — Telephone Encounter (Signed)
Unable to reach pt or leave a message  Patient has an appointment 08-06-14 with dr berry. Samples given to [place], dr berry's nurse to give to patient at time of appt.

## 2014-08-06 ENCOUNTER — Encounter: Payer: Self-pay | Admitting: Cardiovascular Disease

## 2014-08-06 ENCOUNTER — Ambulatory Visit (INDEPENDENT_AMBULATORY_CARE_PROVIDER_SITE_OTHER): Payer: Medicare Other | Admitting: Cardiovascular Disease

## 2014-08-06 VITALS — BP 138/72 | HR 52 | Ht 63.0 in | Wt 135.8 lb

## 2014-08-06 DIAGNOSIS — E785 Hyperlipidemia, unspecified: Secondary | ICD-10-CM

## 2014-08-06 DIAGNOSIS — I10 Essential (primary) hypertension: Secondary | ICD-10-CM

## 2014-08-06 NOTE — Assessment & Plan Note (Signed)
History of hypertension with blood pressure measured today at 138/72. She is on Benicar 40 mg a day along with hydrochlorothiazide 25 mg a day and metoprolol. Continue current medications at current dosing

## 2014-08-06 NOTE — Assessment & Plan Note (Signed)
History of hyperlipidemia on atorvastatin 23 some blood work performed by her primary care physician 10/15/13 revealed a total cholesterol 138, LDL of 73 and HDL of 50. Continue current medications at current dosing

## 2014-08-06 NOTE — Progress Notes (Signed)
08/06/2014 Whitney [name]   10-13-1929  [id number]  Primary Physician Colette RibasGOLDING, JOHN CABOT, MD Primary Cardiologist: Runell GessJonathan J. Haelie Clapp MD Whitney RenoFACP,FACC,FAHA, FSCAI   HPI:  The patient is a very pleasant 80 year old thin-appearing divorced Caucasian female with no children, whose brother-in-law,  Whitney [name], is also a patient of mine. I last saw her a year ago. Her problems include hypertension, hyperlipidemia, and recently discontinued tobacco abuse. She otherwise remains asymptomatic and denies chest pain or shortness of breath. Since I saw her last she has fallen and broken her left hip requiring total hip replacement 12/25/13. She spent 6 weeks at the pain center rehabilitating. Recent blood work performed by Dr. Phillips OdorGolding 10/15/13 revealed a total cholesterol 138, LDL 73 and HDL of 50.   Current Outpatient Prescriptions  Medication Sig Dispense Refill  . albuterol-ipratropium (COMBIVENT) 18-103 MCG/ACT inhaler Inhale 2 puffs into the lungs every 4 (four) hours as needed for wheezing or shortness of breath.    [name]. aspirin EC 81 MG tablet Take 81 mg by mouth every morning.     [name]. atorvastatin (LIPITOR) 20 MG tablet Take 10 mg by mouth at bedtime.     [name]. BENICAR 40 MG tablet Take 40 mg by mouth daily.    . Calcium Carb-Cholecalciferol (CALCIUM 500 +D) 500-400 MG-UNIT TABS Take 1 tablet by mouth at bedtime.     . cloNIDine (CATAPRES) 0.1 MG tablet Take 0.1-0.2 mg by mouth 2 (two) times daily. Two tablets taken in the morning and one tablet at super    . diazepam (VALIUM) 5 MG tablet Take 1 tablet (5 mg total) by mouth daily as needed for anxiety. 30 tablet 5  . doxazosin (CARDURA) 2 MG tablet Take 2 mg by mouth at bedtime.    . hydrochlorothiazide (HYDRODIURIL) 25 MG tablet Take 25 mg by mouth every morning.     [name]. HYDROcodone-acetaminophen (NORCO/VICODIN) 5-325 MG per tablet 1-2 by mouth by every 6 hours as needed for moderate pain. MAX APAP-3 GM/24HOURS FROM ALL SOURCES 120 tablet 0  . metoprolol  succinate (TOPROL-XL) 100 MG 24 hr tablet Take 100 mg by mouth every morning.     . nicotine (NICODERM CQ - DOSED IN MG/24 HOURS) 14 mg/24hr patch Place 1 patch (14 mg total) onto the skin daily.    . Omega-3 Fatty Acids (FISH OIL) 1000 MG CAPS Take 1,000 mg by mouth at bedtime.     . potassium chloride (K-DUR) 10 MEQ tablet Take 1 tablet (10 mEq total) by mouth daily.    . rivaroxaban (XARELTO) 10 MG TABS tablet Take 10 mg by mouth daily.     No current facility-administered medications for this visit.    Allergies  Allergen Reactions  . Prednisone Other (See Comments)    Increased Blood pressure levels    History   Social History  . Marital Status: Divorced    Spouse Name: N/A    Number of Children: N/A  . Years of Education: N/A   Occupational History  . Not on file.   Social History Main Topics  . Smoking status: Former Smoker -- 1.00 packs/day for 50 years    Types: Cigarettes  . Smokeless tobacco: Not on file  . Alcohol Use: No  . Drug Use: No  . Sexual Activity: Not on file   Other Topics Concern  . Not on file   Social History Narrative     Review of Systems: General: negative for chills, fever, night sweats or weight changes.  Cardiovascular: negative for chest pain, dyspnea on exertion, edema, orthopnea, palpitations, paroxysmal nocturnal dyspnea or shortness of breath Dermatological: negative for rash Respiratory: negative for cough or wheezing Urologic: negative for hematuria Abdominal: negative for nausea, vomiting, diarrhea, bright red blood per rectum, melena, or hematemesis Neurologic: negative for visual changes, syncope, or dizziness All other systems reviewed and are otherwise negative except as noted above.    Blood pressure 138/72, pulse 52, height 5\' 3"  (1.6 m), weight 135 lb 12.8 oz (61.598 kg).  General appearance: alert and no distress Neck: no adenopathy, no carotid bruit, no JVD, supple, symmetrical, trachea midline and thyroid not  enlarged, symmetric, no tenderness/mass/nodules Lungs: clear to auscultation bilaterally Heart: regular rate and rhythm, S1, S2 normal, no murmur, click, rub or gallop Extremities: extremities normal, atraumatic, no cyanosis or edema  EKG sinus bradycardia at 52 without ST or T-wave changes. I personally reviewed this EKG  ASSESSMENT AND PLAN:   Essential hypertension History of hypertension with blood pressure measured today at 138/72. She is on Benicar 40 mg a day along with hydrochlorothiazide 25 mg a day and metoprolol. Continue current medications at current dosing  Hyperlipidemia History of hyperlipidemia on atorvastatin 23 some blood work performed by her primary care physician 10/15/13 revealed a total cholesterol 138, LDL of 73 and HDL of 50. Continue current medications at current dosing      Runell GessJonathan J. Issiah Huffaker MD [facility] HealthFACP,FACC,FAHA, [facility] CtrFSCAI 08/06/2014 11:43 AM

## 2014-08-06 NOTE — Patient Instructions (Signed)
Your physician wants you to follow-up in 1 year with Dr. Berry. You will receive a reminder letter in the mail 2 months in advance. If you do not receive a letter, please call our office to schedule the follow-up appointment.  

## 2014-08-09 ENCOUNTER — Encounter: Payer: Self-pay | Admitting: Cardiovascular Disease

## 2014-08-10 ENCOUNTER — Encounter: Payer: Self-pay | Admitting: *Deleted

## 2014-10-29 DIAGNOSIS — I10 Essential (primary) hypertension: Secondary | ICD-10-CM | POA: Diagnosis not present

## 2014-10-29 DIAGNOSIS — Z6823 Body mass index (BMI) 23.0-23.9, adult: Secondary | ICD-10-CM | POA: Diagnosis not present

## 2014-10-29 DIAGNOSIS — E782 Mixed hyperlipidemia: Secondary | ICD-10-CM | POA: Diagnosis not present

## 2014-11-04 DIAGNOSIS — Z0000 Encounter for general adult medical examination without abnormal findings: Secondary | ICD-10-CM | POA: Diagnosis not present

## 2014-11-04 DIAGNOSIS — Z6822 Body mass index (BMI) 22.0-22.9, adult: Secondary | ICD-10-CM | POA: Diagnosis not present

## 2014-11-04 DIAGNOSIS — R7309 Other abnormal glucose: Secondary | ICD-10-CM | POA: Diagnosis not present

## 2015-03-18 ENCOUNTER — Encounter: Payer: Self-pay | Admitting: Cardiovascular Disease

## 2015-03-22 DIAGNOSIS — R7309 Other abnormal glucose: Secondary | ICD-10-CM | POA: Diagnosis not present

## 2015-06-14 DIAGNOSIS — F419 Anxiety disorder, unspecified: Secondary | ICD-10-CM | POA: Diagnosis not present

## 2015-06-14 DIAGNOSIS — Z6824 Body mass index (BMI) 24.0-24.9, adult: Secondary | ICD-10-CM | POA: Diagnosis not present

## 2015-06-14 DIAGNOSIS — I10 Essential (primary) hypertension: Secondary | ICD-10-CM | POA: Diagnosis not present

## 2015-06-14 DIAGNOSIS — Z1389 Encounter for screening for other disorder: Secondary | ICD-10-CM | POA: Diagnosis not present

## 2015-08-09 ENCOUNTER — Encounter: Payer: Self-pay | Admitting: Cardiovascular Disease

## 2015-08-09 ENCOUNTER — Ambulatory Visit (INDEPENDENT_AMBULATORY_CARE_PROVIDER_SITE_OTHER): Payer: Medicare Other | Admitting: Cardiovascular Disease

## 2015-08-09 VITALS — BP 156/110 | HR 67 | Ht 61.0 in | Wt 145.0 lb

## 2015-08-09 DIAGNOSIS — E785 Hyperlipidemia, unspecified: Secondary | ICD-10-CM | POA: Diagnosis not present

## 2015-08-09 DIAGNOSIS — I10 Essential (primary) hypertension: Secondary | ICD-10-CM | POA: Diagnosis not present

## 2015-08-09 NOTE — Assessment & Plan Note (Signed)
History of hypertension with blood pressure measured in the office 156/110. She says that she measures her blood pressure at home it runs in the 150/80 range. She is on clonidine, hydro-chlorothiazide, and metoprolol as well as Benicar. Continue current meds at current dosing

## 2015-08-09 NOTE — Patient Instructions (Signed)

## 2015-08-09 NOTE — Assessment & Plan Note (Signed)
History of hyperlipidemia on pravastatin followed by her PCP 

## 2015-08-09 NOTE — Progress Notes (Signed)
08/09/2015 Whitney [name]   March 24, 1930  [id number]  Primary Physician [name], MD Primary Cardiologist: [name] MD Whitney [name]   HPI:  [name] is a very pleasant 81 year old thin-appearing divorced Caucasian female with no children, whose brother-in-law, Whitney [name], is also a [name] of mine. I last saw her a year ago. Her problems include hypertension, hyperlipidemia, and recently discontinued tobacco abuse. She otherwise remains asymptomatic and denies chest pain or shortness of breath. She fell and broke  her left hip requiring total hip replacement 12/25/13. She spent 6 weeks at the pain center rehabilitating. Recent blood work performed by [name] 11/04/14 revealed a total cholesterol 135, LDL 63 and HDL of 47   Current Outpatient Prescriptions  Medication Sig Dispense Refill  . albuterol-ipratropium (COMBIVENT) 18-103 MCG/ACT inhaler Inhale 2 puffs into the lungs every 4 (four) hours as needed for wheezing or shortness of breath.    [name] aspirin EC 81 MG tablet Take 81 mg by mouth every morning.     [name] atorvastatin (LIPITOR) 20 MG tablet Take 10 mg by mouth at bedtime.     [name] BENICAR 40 MG tablet Take 40 mg by mouth daily.    . Calcium Carb-Cholecalciferol (CALCIUM 500 +D) 500-400 MG-UNIT TABS Take 1 tablet by mouth at bedtime.     . cloNIDine (CATAPRES) 0.1 MG tablet Take 0.1-0.2 mg by mouth 2 (two) times daily. Two tablets taken in the morning and one tablet at super    . diazepam (VALIUM) 5 MG tablet Take 1 tablet (5 mg total) by mouth daily as needed for anxiety. 30 tablet 5  . doxazosin (CARDURA) 2 MG tablet Take 2 mg by mouth at bedtime.    . hydrochlorothiazide (HYDRODIURIL) 25 MG tablet Take 25 mg by mouth every morning.     . metoprolol succinate (TOPROL-XL) 100 MG 24 hr tablet Take 100 mg by mouth every morning.     . Omega-3 Fatty Acids (FISH OIL) 1000 MG CAPS Take 1,000 mg by mouth at bedtime.     . potassium chloride (K-DUR) 10 MEQ  tablet Take 1 tablet (10 mEq total) by mouth daily.     No current facility-administered medications for this visit.    Allergies  Allergen Reactions  . Prednisone Other (See Comments)    Increased Blood pressure levels    Social History   Social History  . Marital Status: Divorced    Spouse Name: N/A  . Number of Children: N/A  . Years of Education: N/A   Occupational History  . Not on file.   Social History Main Topics  . Smoking status: Former Smoker -- 1.00 packs/day for 50 years    Types: Cigarettes  . Smokeless tobacco: Not on file  . Alcohol Use: No  . Drug Use: No  . Sexual Activity: Not on file   Other Topics Concern  . Not on file   Social History Narrative     Review of Systems: General: negative for chills, fever, night sweats or weight changes.  Cardiovascular: negative for chest pain, dyspnea on exertion, edema, orthopnea, palpitations, paroxysmal nocturnal dyspnea or shortness of breath Dermatological: negative for rash Respiratory: negative for cough or wheezing Urologic: negative for hematuria Abdominal: negative for nausea, vomiting, diarrhea, bright red blood per rectum, melena, or hematemesis Neurologic: negative for visual changes, syncope, or dizziness All other systems reviewed and are otherwise negative except as noted above.    Blood pressure 156/110, pulse 67, height 5'  1" (1.549 m), weight 145 lb (65.772 kg).  General appearance: alert and no distress Neck: no adenopathy, no carotid bruit, no JVD, supple, symmetrical, trachea midline and thyroid not enlarged, symmetric, no tenderness/mass/nodules Lungs: clear to auscultation bilaterally Heart: regular rate and rhythm, S1, S2 normal, no murmur, click, rub or gallop Extremities: extremities normal, atraumatic, no cyanosis or edema  EKG sinus rhythm at 67 without ST or T-wave changes. I personally reviewed this EKG  ASSESSMENT AND PLAN:   Unspecified essential hypertension History of  hypertension with blood pressure measured in the office 156/110. She says that she measures her blood pressure at home it runs in the 150/80 range. She is on clonidine, hydro-chlorothiazide, and metoprolol as well as Benicar. Continue current meds at current dosing  Hyperlipidemia History of hyperlipidemia on pravastatin followed by her PCP      Runell GessJonathan J. Gwenlyn Hottinger MD [facility] LLCFACP,FACC,FAHA, [facility] DeridderFSCAI 08/09/2015 2:34 PM

## 2016-01-23 DIAGNOSIS — Z1389 Encounter for screening for other disorder: Secondary | ICD-10-CM | POA: Diagnosis not present

## 2016-01-23 DIAGNOSIS — E119 Type 2 diabetes mellitus without complications: Secondary | ICD-10-CM | POA: Diagnosis not present

## 2016-01-23 DIAGNOSIS — E782 Mixed hyperlipidemia: Secondary | ICD-10-CM | POA: Diagnosis not present

## 2016-01-23 DIAGNOSIS — I10 Essential (primary) hypertension: Secondary | ICD-10-CM | POA: Diagnosis not present

## 2016-02-07 DIAGNOSIS — E782 Mixed hyperlipidemia: Secondary | ICD-10-CM | POA: Diagnosis not present

## 2016-02-07 DIAGNOSIS — I10 Essential (primary) hypertension: Secondary | ICD-10-CM | POA: Diagnosis not present

## 2016-02-07 DIAGNOSIS — Z0000 Encounter for general adult medical examination without abnormal findings: Secondary | ICD-10-CM | POA: Diagnosis not present

## 2016-02-07 DIAGNOSIS — R7309 Other abnormal glucose: Secondary | ICD-10-CM | POA: Diagnosis not present

## 2016-02-07 DIAGNOSIS — E119 Type 2 diabetes mellitus without complications: Secondary | ICD-10-CM | POA: Diagnosis not present

## 2016-02-13 DIAGNOSIS — Z6824 Body mass index (BMI) 24.0-24.9, adult: Secondary | ICD-10-CM | POA: Diagnosis not present

## 2016-02-13 DIAGNOSIS — I10 Essential (primary) hypertension: Secondary | ICD-10-CM | POA: Diagnosis not present

## 2016-02-13 DIAGNOSIS — Z0001 Encounter for general adult medical examination with abnormal findings: Secondary | ICD-10-CM | POA: Diagnosis not present

## 2016-04-09 ENCOUNTER — Other Ambulatory Visit: Payer: Self-pay | Admitting: Pharmacist

## 2016-04-09 NOTE — Patient Outreach (Signed)
Outreach call to Randell Patientebecca S Erven regarding her request for follow [facility]. Left a HIPAA compliant message on the patient's voicemail.  Duanne MoronElisabeth Correne Lalani, PharmD Clinical Pharmacist [facility] [phone]

## 2016-08-08 ENCOUNTER — Ambulatory Visit: Payer: Medicare Other | Admitting: Cardiovascular Disease

## 2016-09-11 ENCOUNTER — Ambulatory Visit: Payer: Medicare Other | Admitting: Cardiovascular Disease

## 2016-10-10 ENCOUNTER — Encounter: Payer: Self-pay | Admitting: Cardiovascular Disease

## 2016-10-10 ENCOUNTER — Ambulatory Visit (INDEPENDENT_AMBULATORY_CARE_PROVIDER_SITE_OTHER): Payer: Medicare Other | Admitting: Cardiovascular Disease

## 2016-10-10 VITALS — BP 148/90 | HR 84 | Ht 64.0 in | Wt 147.8 lb

## 2016-10-10 DIAGNOSIS — I10 Essential (primary) hypertension: Secondary | ICD-10-CM | POA: Diagnosis not present

## 2016-10-10 DIAGNOSIS — E7800 Pure hypercholesterolemia, unspecified: Secondary | ICD-10-CM

## 2016-10-10 NOTE — Patient Instructions (Signed)

## 2016-10-10 NOTE — Assessment & Plan Note (Addendum)
History of hyperlipidemia on statin therapy with lipid profile performed by her PCP 02/07/16 revealed a total cholesterol of 167, LDL 84 and HDL of 57.

## 2016-10-10 NOTE — Progress Notes (Signed)
10/10/2016 Whitney [name]   [birth year]  [id number]  Primary Physician [name], MD Primary Cardiologist: [name] MD Roseanne Reno  HPI:  The patient is a very pleasant 83 year old thin-appearing divorced Caucasian female with no children, whose brother-in-law, [name], is also a patient of mine. I last saw her in the office 08/09/15 Her problems include hypertension, hyperlipidemia, and recently discontinued tobacco abuse. She otherwise remains asymptomatic and denies chest pain or shortness of breath. She fell and broke  her left hip requiring total hip replacement 12/25/13. She spent 6 weeks at the pain center rehabilitating. Recent blood work performed by [name] 02/07/16 revealed a total cholesterol 167, LDL 84 and HDL 57.   Current Outpatient Prescriptions  Medication Sig Dispense Refill  . albuterol-ipratropium (COMBIVENT) 18-103 MCG/ACT inhaler Inhale 2 puffs into the lungs every 4 (four) hours as needed for wheezing or shortness of breath.    [name] aspirin EC 81 MG tablet Take 81 mg by mouth every morning.     [name] atorvastatin (LIPITOR) 20 MG tablet Take 10 mg by mouth at bedtime.     [name] BENICAR 40 MG tablet Take 40 mg by mouth daily.    . Calcium Carb-Cholecalciferol (CALCIUM 500 +D) 500-400 MG-UNIT TABS Take 1 tablet by mouth at bedtime.     . cloNIDine (CATAPRES) 0.1 MG tablet Take 0.1-0.2 mg by mouth 2 (two) times daily. Two tablets taken in the morning and one tablet at super    . diazepam (VALIUM) 5 MG tablet Take 1 tablet (5 mg total) by mouth daily as needed for anxiety. 30 tablet 5  . doxazosin (CARDURA) 2 MG tablet Take 2 mg by mouth at bedtime.    . hydrochlorothiazide (HYDRODIURIL) 25 MG tablet Take 25 mg by mouth every morning.     . metoprolol succinate (TOPROL-XL) 100 MG 24 hr tablet Take 100 mg by mouth every morning.     . Omega-3 Fatty Acids (FISH OIL) 1000 MG CAPS Take 1,000 mg by mouth at bedtime.     . potassium chloride (K-DUR) 10  MEQ tablet Take 1 tablet (10 mEq total) by mouth daily.     No current facility-administered medications for this visit.     Allergies  Allergen Reactions  . Prednisone Other (See Comments)    Increased Blood pressure levels    Social History   Social History  . Marital status: Divorced    Spouse name: N/A  . Number of children: N/A  . Years of education: N/A   Occupational History  . Not on file.   Social History Main Topics  . Smoking status: Former Smoker    Packs/day: 1.00    Years: 50.00    Types: Cigarettes  . Smokeless tobacco: Never Used  . Alcohol use No  . Drug use: No  . Sexual activity: Not on file   Other Topics Concern  . Not on file   Social History Narrative  . No narrative on file     Review of Systems: General: negative for chills, fever, night sweats or weight changes.  Cardiovascular: negative for chest pain, dyspnea on exertion, edema, orthopnea, palpitations, paroxysmal nocturnal dyspnea or shortness of breath Dermatological: negative for rash Respiratory: negative for cough or wheezing Urologic: negative for hematuria Abdominal: negative for nausea, vomiting, diarrhea, bright red blood per rectum, melena, or hematemesis Neurologic: negative for visual changes, syncope, or dizziness All other systems reviewed and are otherwise negative except as noted  above.    Blood pressure (!) 168/92, pulse 84, height 5\' 4"  (1.626 m), weight 147 lb 12.8 oz (67 kg).  General appearance: alert and no distress Neck: no adenopathy, no carotid bruit, no JVD, supple, symmetrical, trachea midline and thyroid not enlarged, symmetric, no tenderness/mass/nodules Lungs: clear to auscultation bilaterally Heart: regular rate and rhythm, S1, S2 normal, no murmur, click, rub or gallop Extremities: extremities normal, atraumatic, no cyanosis or edema  EKG sinus rhythm at 84 with left axis deviation. I personally reviewed that EKG  ASSESSMENT AND PLAN:   Essential  hypertension History of hypertension blood pressure measured 148/90. She is on Benicar, clonidine, Cardura, hydrochlorothiazide and metoprolol. Continue current meds at current dosing  Hyperlipidemia History of hyperlipidemia on statin therapy with lipid profile performed by her PCP 02/07/16 revealed a total cholesterol of 167, LDL 84 and HDL of 57.      Runell GessJonathan J. Rhetta Cleek MD [facility] CenterFACP,FACC,FAHA, Lake'S Crossing CenterFSCAI 10/10/2016 12:22 PM

## 2016-10-10 NOTE — Assessment & Plan Note (Signed)
History of hypertension blood pressure measured 148/90. She is on Benicar, clonidine, Cardura, hydrochlorothiazide and metoprolol. Continue current meds at current dosing

## 2016-11-06 DIAGNOSIS — M810 Age-related osteoporosis without current pathological fracture: Secondary | ICD-10-CM | POA: Diagnosis not present

## 2016-11-06 DIAGNOSIS — Z6824 Body mass index (BMI) 24.0-24.9, adult: Secondary | ICD-10-CM | POA: Diagnosis not present

## 2016-11-06 DIAGNOSIS — I10 Essential (primary) hypertension: Secondary | ICD-10-CM | POA: Diagnosis not present

## 2016-11-06 DIAGNOSIS — M3500 Sicca syndrome, unspecified: Secondary | ICD-10-CM | POA: Diagnosis not present

## 2016-12-12 DIAGNOSIS — H01025 Squamous blepharitis left lower eyelid: Secondary | ICD-10-CM | POA: Diagnosis not present

## 2016-12-12 DIAGNOSIS — H04123 Dry eye syndrome of bilateral lacrimal glands: Secondary | ICD-10-CM | POA: Diagnosis not present

## 2016-12-12 DIAGNOSIS — H01022 Squamous blepharitis right lower eyelid: Secondary | ICD-10-CM | POA: Diagnosis not present

## 2016-12-12 DIAGNOSIS — H01024 Squamous blepharitis left upper eyelid: Secondary | ICD-10-CM | POA: Diagnosis not present

## 2016-12-12 DIAGNOSIS — H01021 Squamous blepharitis right upper eyelid: Secondary | ICD-10-CM | POA: Diagnosis not present

## 2016-12-12 DIAGNOSIS — E119 Type 2 diabetes mellitus without complications: Secondary | ICD-10-CM | POA: Diagnosis not present

## 2017-02-27 DIAGNOSIS — I10 Essential (primary) hypertension: Secondary | ICD-10-CM | POA: Diagnosis not present

## 2017-02-27 DIAGNOSIS — M810 Age-related osteoporosis without current pathological fracture: Secondary | ICD-10-CM | POA: Diagnosis not present

## 2017-02-27 DIAGNOSIS — R300 Dysuria: Secondary | ICD-10-CM | POA: Diagnosis not present

## 2017-03-12 DIAGNOSIS — Z79891 Long term (current) use of opiate analgesic: Secondary | ICD-10-CM | POA: Diagnosis not present

## 2017-03-12 DIAGNOSIS — E748 Other specified disorders of carbohydrate metabolism: Secondary | ICD-10-CM | POA: Diagnosis not present

## 2017-03-12 DIAGNOSIS — E785 Hyperlipidemia, unspecified: Secondary | ICD-10-CM | POA: Diagnosis not present

## 2017-03-12 DIAGNOSIS — I10 Essential (primary) hypertension: Secondary | ICD-10-CM | POA: Diagnosis not present

## 2017-03-12 DIAGNOSIS — Z0000 Encounter for general adult medical examination without abnormal findings: Secondary | ICD-10-CM | POA: Diagnosis not present

## 2017-03-19 DIAGNOSIS — Z0000 Encounter for general adult medical examination without abnormal findings: Secondary | ICD-10-CM | POA: Diagnosis not present

## 2017-03-19 DIAGNOSIS — I10 Essential (primary) hypertension: Secondary | ICD-10-CM | POA: Diagnosis not present

## 2017-03-19 DIAGNOSIS — E782 Mixed hyperlipidemia: Secondary | ICD-10-CM | POA: Diagnosis not present

## 2017-03-19 DIAGNOSIS — E119 Type 2 diabetes mellitus without complications: Secondary | ICD-10-CM | POA: Diagnosis not present

## 2017-03-19 DIAGNOSIS — Z1389 Encounter for screening for other disorder: Secondary | ICD-10-CM | POA: Diagnosis not present

## 2017-03-19 DIAGNOSIS — M810 Age-related osteoporosis without current pathological fracture: Secondary | ICD-10-CM | POA: Diagnosis not present

## 2017-06-03 DIAGNOSIS — Z23 Encounter for immunization: Secondary | ICD-10-CM | POA: Diagnosis not present

## 2017-09-04 DIAGNOSIS — G894 Chronic pain syndrome: Secondary | ICD-10-CM | POA: Diagnosis not present

## 2017-09-04 DIAGNOSIS — Z6824 Body mass index (BMI) 24.0-24.9, adult: Secondary | ICD-10-CM | POA: Diagnosis not present

## 2017-09-04 DIAGNOSIS — Z1389 Encounter for screening for other disorder: Secondary | ICD-10-CM | POA: Diagnosis not present

## 2017-09-18 DIAGNOSIS — Z6824 Body mass index (BMI) 24.0-24.9, adult: Secondary | ICD-10-CM | POA: Diagnosis not present

## 2017-09-18 DIAGNOSIS — J029 Acute pharyngitis, unspecified: Secondary | ICD-10-CM | POA: Diagnosis not present

## 2017-09-18 DIAGNOSIS — J301 Allergic rhinitis due to pollen: Secondary | ICD-10-CM | POA: Diagnosis not present

## 2017-10-14 ENCOUNTER — Emergency Department (HOSPITAL_COMMUNITY): Payer: Medicare Other

## 2017-10-14 ENCOUNTER — Other Ambulatory Visit: Payer: Self-pay

## 2017-10-14 ENCOUNTER — Encounter (HOSPITAL_COMMUNITY): Payer: Self-pay | Admitting: Emergency Medicine

## 2017-10-14 ENCOUNTER — Emergency Department (HOSPITAL_COMMUNITY)
Admission: EM | Admit: 2017-10-14 | Discharge: 2017-10-15 | Disposition: A | Discharge status: Home / Self Care | Payer: Medicare Other | Attending: Emergency Medicine | Admitting: Emergency Medicine

## 2017-10-14 DIAGNOSIS — W19XXXA Unspecified fall, initial encounter: Secondary | ICD-10-CM

## 2017-10-14 DIAGNOSIS — J40 Bronchitis, not specified as acute or chronic: Secondary | ICD-10-CM | POA: Diagnosis not present

## 2017-10-14 DIAGNOSIS — Z79899 Other long term (current) drug therapy: Secondary | ICD-10-CM | POA: Insufficient documentation

## 2017-10-14 DIAGNOSIS — Z7982 Long term (current) use of aspirin: Secondary | ICD-10-CM | POA: Diagnosis not present

## 2017-10-14 DIAGNOSIS — S0990XA Unspecified injury of head, initial encounter: Secondary | ICD-10-CM | POA: Diagnosis not present

## 2017-10-14 DIAGNOSIS — S79912A Unspecified injury of left hip, initial encounter: Secondary | ICD-10-CM | POA: Diagnosis not present

## 2017-10-14 DIAGNOSIS — M25551 Pain in right hip: Secondary | ICD-10-CM | POA: Diagnosis not present

## 2017-10-14 DIAGNOSIS — I10 Essential (primary) hypertension: Secondary | ICD-10-CM | POA: Diagnosis not present

## 2017-10-14 DIAGNOSIS — R05 Cough: Secondary | ICD-10-CM | POA: Diagnosis not present

## 2017-10-14 DIAGNOSIS — M25559 Pain in unspecified hip: Secondary | ICD-10-CM

## 2017-10-14 DIAGNOSIS — S79911A Unspecified injury of right hip, initial encounter: Secondary | ICD-10-CM | POA: Diagnosis not present

## 2017-10-14 DIAGNOSIS — Z87891 Personal history of nicotine dependence: Secondary | ICD-10-CM | POA: Insufficient documentation

## 2017-10-14 DIAGNOSIS — R531 Weakness: Secondary | ICD-10-CM | POA: Diagnosis not present

## 2017-10-14 DIAGNOSIS — R404 Transient alteration of awareness: Secondary | ICD-10-CM | POA: Diagnosis not present

## 2017-10-14 LAB — CBC WITH DIFFERENTIAL/PLATELET
Basophils Absolute: 0 10*3/uL (ref 0.0–0.1)
Basophils Relative: 0 %
EOS ABS: 0 10*3/uL (ref 0.0–0.7)
EOS PCT: 0 %
HCT: 33.4 % — ABNORMAL LOW (ref 36.0–46.0)
Hemoglobin: 11.2 g/dL — ABNORMAL LOW (ref 12.0–15.0)
LYMPHS ABS: 0.4 10*3/uL — AB (ref 0.7–4.0)
Lymphocytes Relative: 4 %
MCH: 30.9 pg (ref 26.0–34.0)
MCHC: 33.5 g/dL (ref 30.0–36.0)
MCV: 92.3 fL (ref 78.0–100.0)
MONO ABS: 0.9 10*3/uL (ref 0.1–1.0)
Monocytes Relative: 9 %
Neutro Abs: 7.8 10*3/uL — ABNORMAL HIGH (ref 1.7–7.7)
Neutrophils Relative %: 87 %
Platelets: 253 10*3/uL (ref 150–400)
RBC: 3.62 MIL/uL — ABNORMAL LOW (ref 3.87–5.11)
RDW: 12.3 % (ref 11.5–15.5)
WBC: 9.1 10*3/uL (ref 4.0–10.5)

## 2017-10-14 LAB — COMPREHENSIVE METABOLIC PANEL
ALT: 20 U/L (ref 14–54)
ANION GAP: 13 (ref 5–15)
AST: 29 U/L (ref 15–41)
Albumin: 3.9 g/dL (ref 3.5–5.0)
Alkaline Phosphatase: 57 U/L (ref 38–126)
BUN: 13 mg/dL (ref 6–20)
CO2: 26 mmol/L (ref 22–32)
CREATININE: 0.82 mg/dL (ref 0.44–1.00)
Calcium: 9.3 mg/dL (ref 8.9–10.3)
Chloride: 89 mmol/L — ABNORMAL LOW (ref 101–111)
GFR calc Af Amer: >60 mL/min (ref >60)
Glucose, Bld: 130 mg/dL — ABNORMAL HIGH (ref 65–99)
POTASSIUM: 3.4 mmol/L — AB (ref 3.5–5.1)
Sodium: 128 mmol/L — ABNORMAL LOW (ref 135–145)
Total Bilirubin: 0.6 mg/dL (ref 0.3–1.2)
Total Protein: 7.4 g/dL (ref 6.5–8.1)

## 2017-10-14 LAB — CK: Total CK: 198 U/L (ref 38–234)

## 2017-10-14 LAB — TROPONIN I: Troponin I: <0.03 ng/mL (ref <0.03)

## 2017-10-14 LAB — RAPID STREP SCREEN (MED CTR MEBANE ONLY): Streptococcus, Group A Screen (Direct): NEGATIVE

## 2017-10-14 MED ORDER — SODIUM CHLORIDE 0.9 % IV BOLUS (SEPSIS)
500.0000 mL | Freq: Once | INTRAVENOUS | Status: AC
  Administered 2017-10-14: 500 mL via INTRAVENOUS

## 2017-10-14 NOTE — ED Triage Notes (Signed)
Pt went to PCP for sore throat this morning, given Zpak, took 2 this morning. Pt lives alone, went to close her blinds, and she states she started backing up and fell to the floor, states she could not get up. Just felt weak all over.

## 2017-10-14 NOTE — ED Provider Notes (Signed)
[facility] EMERGENCY DEPARTMENT Provider Note   CSN: [id number] Arrival date & time: 10/14/17  2109     History   Chief Complaint Chief Complaint  Patient presents with  . Weakness    HPI Whitney [name] is a 84 y.o. female.  HPI She states she has had 2 days of cough productive of yellow sputum and sore throat.  Her primary physician called in her a Z-Pak this morning.  States she took the first dose today.  This evening she went to close the blinds and fell backward.  Denies hitting her head or loss of consciousness.  She is unable to get up off the floor.  She states she was there for roughly 3 hours.  When she was finally helped up she was unable to bear weight on her right leg.  She states she does have some pain to the hip. Past Medical History:  Diagnosis Date  . Hyperlipidemia   . Hypertension   . Tobacco abuse    discontinued tobacco abuse 12/25/13    Patient Active Problem List   Diagnosis Date Noted  . Unspecified essential hypertension 01/04/2014  . Senile osteoporosis 01/04/2014  . Acute blood loss anemia 12/28/2013  . Acute respiratory failure with hypoxia (HCC) 12/28/2013  . Fracture of femoral neck, left, closed (HCC) 12/25/2013  . Tobacco abuse 12/25/2013  . Essential hypertension 08/05/2013  . Hyperlipidemia 08/05/2013    Past Surgical History:  Procedure Laterality Date  . HIP ARTHROPLASTY Left 12/26/2013   Procedure: ARTHROPLASTY BIPOLAR HIP LEFT;  Surgeon: Darreld Mclean, MD;  Location: [facility];  Service: Orthopedics;  Laterality: Left;    OB History    No data available       Home Medications    Prior to Admission medications   Medication Sig Start Date End Date Taking? Authorizing Provider  albuterol-ipratropium (COMBIVENT) 18-103 MCG/ACT inhaler Inhale 2 puffs into the lungs every 4 (four) hours as needed for wheezing or shortness of breath. 12/30/13   Elliot Cousin, MD  aspirin EC 81 MG tablet Take 81 mg by mouth every morning.      [provider]  atorvastatin (LIPITOR) 20 MG tablet Take 10 mg by mouth at bedtime.     [provider]  BENICAR 40 MG tablet Take 40 mg by mouth daily. 07/23/13   [provider]  Calcium Carb-Cholecalciferol (CALCIUM 500 +D) 500-400 MG-UNIT TABS Take 1 tablet by mouth at bedtime.     [provider]  cloNIDine (CATAPRES) 0.1 MG tablet Take 0.1-0.2 mg by mouth 2 (two) times daily. Two tablets taken in the morning and one tablet at super 07/14/13   [provider]  diazepam (VALIUM) 5 MG tablet Take 1 tablet (5 mg total) by mouth daily as needed for anxiety. 12/31/13   Sharon Seller, NP  doxazosin (CARDURA) 2 MG tablet Take 2 mg by mouth at bedtime. 07/14/13   [provider]  hydrochlorothiazide (HYDRODIURIL) 25 MG tablet Take 25 mg by mouth every morning.  06/22/13   [provider]  metoprolol succinate (TOPROL-XL) 100 MG 24 hr tablet Take 100 mg by mouth every morning.  07/14/13   [provider]  Omega-3 Fatty Acids (FISH OIL) 1000 MG CAPS Take 1,000 mg by mouth at bedtime.     [provider]  potassium chloride (K-DUR) 10 MEQ tablet Take 1 tablet (10 mEq total) by mouth daily. 12/30/13   Elliot Cousin, MD    Family History Family History  Problem Relation Age of Onset  . Diabetes Sister     Social History Social History   Tobacco Use  . Smoking status: Former Smoker    Packs/day: 1.00    Years: 50.00    Pack years: 50.00    Types: Cigarettes  . Smokeless tobacco: Never Used  Substance Use Topics  . Alcohol use: No    Alcohol/week: 0.0 oz  . Drug use: No     Allergies   Prednisone   Review of Systems Review of Systems  Constitutional: Positive for fatigue. Negative for fever.  HENT: Positive for sore throat. Negative for congestion, trouble swallowing and voice change.   Respiratory: Positive for cough. Negative for shortness of breath and wheezing.   Cardiovascular: Negative for  chest pain, palpitations and leg swelling.  Gastrointestinal: Negative for abdominal pain, constipation, diarrhea, nausea and vomiting.  Genitourinary: Negative for dysuria, flank pain, frequency and hematuria.  Musculoskeletal: Positive for arthralgias. Negative for back pain, myalgias, neck pain and neck stiffness.  Skin: Negative for rash and wound.  Neurological: Positive for weakness. Negative for dizziness, syncope, speech difficulty, light-headedness, numbness and headaches.  All other systems reviewed and are negative.    Physical Exam Updated Vital Signs BP 128/74   Pulse 68   Temp 98.1 F (36.7 C) (Oral)   Resp 19   Ht 5\' 5"  (1.651 m)   Wt 63.5 kg (140 lb)   SpO2 95%   BMI 23.30 kg/m   Physical Exam  Constitutional: She is oriented to person, place, and time. She appears well-developed and well-nourished. No distress.  HENT:  Head: Normocephalic and atraumatic.  Mouth/Throat: Oropharynx is clear and moist.  Mildly erythematous oropharynx.  No evidence of scalp or intraoral trauma.  Eyes: EOM are normal. Pupils are equal, round, and reactive to light.  Neck: Normal range of motion. Neck supple.  No posterior midline cervical tenderness to palpation.  Cardiovascular: Normal rate and regular rhythm. Exam reveals no gallop and no friction rub.  No murmur heard. Pulmonary/Chest: Effort normal. She has rales.  Patient has a few rales in the right base.  Abdominal: Soft. Bowel sounds are normal. There is no tenderness. There is no rebound and no guarding.  Musculoskeletal: Normal range of motion. She exhibits tenderness. She exhibits no edema.  Mild tenderness over the lateral surface of the right hip.  No lower extremity shortening or malrotation.  Distal pulses are intact.  No midline thoracic or lumbar tenderness.  Neurological: She is alert and oriented to person, place, and time.  5/5 motor and all extremities.  Sensation intact.  Skin: Skin is warm and dry. Capillary  refill takes less than 2 seconds. No rash noted. She is not diaphoretic. No erythema.  Psychiatric: She has a normal mood and affect. Her behavior is normal.  Nursing note and vitals reviewed.    ED Treatments / Results  Labs (all labs ordered are listed, but only abnormal results are displayed) Labs Reviewed  CBC WITH DIFFERENTIAL/PLATELET - Abnormal; Notable for the following components:      Result Value   RBC 3.62 (*)    Hemoglobin 11.2 (*)    HCT 33.4 (*)    Neutro Abs 7.8 (*)    Lymphs Abs 0.4 (*)    All other components within normal limits  COMPREHENSIVE METABOLIC PANEL - Abnormal; Notable for the following components:   Sodium 128 (*)    Potassium 3.4 (*)    Chloride 89 (*)    Glucose,  Bld 130 (*)    All other components within normal limits  URINALYSIS, ROUTINE W REFLEX MICROSCOPIC - Abnormal; Notable for the following components:   Color, Urine STRAW (*)    Protein, ur 30 (*)    All other components within normal limits  URINALYSIS, MICROSCOPIC (REFLEX) - Abnormal; Notable for the following components:   Bacteria, UA FEW (*)    Squamous Epithelial / LPF 0-5 (*)    All other components within normal limits  RAPID STREP SCREEN (NOT AT [facility])  CULTURE, GROUP A STREP [facility]  TROPONIN I  CK    EKG  EKG Interpretation None       Radiology Dg Chest 2 View  Result Date: 10/14/2017 CLINICAL DATA:  84 year old female status post fall at home. Right hip pain. Cough. EXAM: CHEST  2 VIEW COMPARISON:  Chest radiographs 12/28/2013 and earlier. FINDINGS: Upright AP and lateral views of the chest. Chronic large lung volumes with increased AP dimension to the chest. Stable cardiac size and mediastinal contours. Mildly tortuous thoracic aorta with calcified atherosclerosis appears stable. Visualized tracheal air column is within normal limits. No pneumothorax, pulmonary edema, pleural effusion or confluent pulmonary opacity. Osteopenia. No acute osseous abnormality identified.  Negative visible bowel gas pattern. IMPRESSION: 1. No acute cardiopulmonary abnormality or acute traumatic injury identified. 2.  Aortic Atherosclerosis (ICD10-I70.0). Electronically Signed   By: Odessa FlemingH  Hall M.D.   On: 10/14/2017 22:22   Ct Head Wo Contrast  Result Date: 10/14/2017 CLINICAL DATA:  Status post fall to floor, with generalized weakness. EXAM: CT HEAD WITHOUT CONTRAST TECHNIQUE: Contiguous axial images were obtained from the base of the skull through the vertex without intravenous contrast. COMPARISON:  None. FINDINGS: Brain: No evidence of acute infarction, hemorrhage, hydrocephalus, extra-axial collection or mass lesion / mass effect. Prominence of the ventricles and sulci reflects mild to moderate cortical volume loss. Mild cerebellar atrophy is noted. Scattered periventricular slight matter change likely reflects small vessel ischemic microangiopathy. A small chronic lacunar infarct is noted at the left thalamus. The brainstem and fourth ventricle are within normal limits. The cerebral hemispheres demonstrate grossly normal gray-white differentiation. No mass effect or midline shift is seen. Vascular: No hyperdense vessel or unexpected calcification. Skull: There is no evidence of fracture; visualized osseous structures are unremarkable in appearance. Sinuses/Orbits: The orbits are within normal limits. The paranasal sinuses and mastoid air cells are well-aerated. Other: No significant soft tissue abnormalities are seen. IMPRESSION: 1. No evidence of traumatic intracranial injury or fracture. 2. Mild to moderate cortical volume loss and scattered small vessel ischemic microangiopathy. 3. Small chronic lacunar infarct at the left thalamus. Electronically Signed   By: Roanna RaiderJeffery  Chang M.D.   On: 10/14/2017 23:38   Dg Hip Unilat With Pelvis 2-3 Views Right  Result Date: 10/14/2017 CLINICAL DATA:  Status post fall backwards, with acute onset of right hip pain. Initial encounter. EXAM: DG HIP (WITH OR  WITHOUT PELVIS) 2-3V RIGHT COMPARISON:  None. FINDINGS: There is no evidence of fracture or dislocation. The patient's left hip arthroplasty is unremarkable in appearance, though incompletely imaged on this study. There is no evidence of loosening. The proximal right femur appears intact. Degenerative change is noted along the lower lumbar spine. Mild sclerosis is seen at the sacroiliac joints. The visualized bowel gas pattern is grossly unremarkable in appearance. IMPRESSION: No evidence of fracture or dislocation. Electronically Signed   By: Roanna RaiderJeffery  Chang M.D.   On: 10/14/2017 22:21    Procedures Procedures (including critical care time)  Medications Ordered in ED Medications  sodium chloride 0.9 % bolus 500 mL (0 mLs Intravenous Stopped 10/14/17 2314)  acetaminophen (TYLENOL) tablet 650 mg (650 mg Oral Given 10/15/17 0211)  ondansetron (ZOFRAN) injection 4 mg (4 mg Intravenous Given 10/15/17 0207)  acetaminophen (TYLENOL) tablet 650 mg (650 mg Oral Given 10/15/17 0858)     Initial Impression / Assessment and Plan / ED Course  I have reviewed the triage vital signs and the nursing notes.  Pertinent labs & imaging results that were available during my care of the patient were reviewed by me and considered in my medical decision making (see chart for details).     Signed out to oncoming emergency physician pending UA and reevaluation.  If patient is still unable to weight-bear on right hip will need CT of the hip to rule out occult fracture.  Final Clinical Impressions(s) / ED Diagnoses   Final diagnoses:  Fall, initial encounter  Bronchitis    ED Discharge Orders    None       Loren Racer, MD 10/15/17 2233

## 2017-10-15 LAB — URINALYSIS, ROUTINE W REFLEX MICROSCOPIC
BILIRUBIN URINE: NEGATIVE
GLUCOSE, UA: NEGATIVE mg/dL
HGB URINE DIPSTICK: NEGATIVE
KETONES UR: NEGATIVE mg/dL
LEUKOCYTES UA: NEGATIVE
Nitrite: NEGATIVE
PH: 6 (ref 5.0–8.0)
Protein, ur: 30 mg/dL — AB
Specific Gravity, Urine: 1.02 (ref 1.005–1.030)

## 2017-10-15 LAB — URINALYSIS, MICROSCOPIC (REFLEX)

## 2017-10-15 MED ORDER — ONDANSETRON HCL 4 MG/2ML IJ SOLN
4.0000 mg | Freq: Once | INTRAMUSCULAR | Status: AC
Start: 2017-10-15 — End: 2017-10-15
  Administered 2017-10-15: 4 mg via INTRAVENOUS
  Filled 2017-10-15: qty 2

## 2017-10-15 MED ORDER — ACETAMINOPHEN 325 MG PO TABS
650.0000 mg | ORAL_TABLET | Freq: Once | ORAL | Status: AC
  Administered 2017-10-15: 650 mg via ORAL
  Filled 2017-10-15: qty 2

## 2017-10-15 NOTE — ED Notes (Signed)
Patient ambulated to restroom with walker with no difficulty noted.

## 2017-10-15 NOTE — Care Management (Signed)
CM consult for [facility] needs. Pt lives alone, ind with ADL's. She drives, has cane and RW. She has used [facility] in the past after SNF stay. Pt fall last night, niece [place] and is present for planning today. Pt would like [facility] again. Aware HH has 48 hrs to make first visit. Olegario MessierKathy, [facility], aware of referral and will pull pt info from chart.

## 2017-10-15 NOTE — ED Notes (Signed)
Patient requesting tylenol for sore throat. Verbal orders obtained.

## 2017-10-15 NOTE — ED Notes (Signed)
Pt ambulatory to bathroom and back to room with standby assist from this nurse.

## 2017-10-15 NOTE — ED Notes (Signed)
Pt ambulatory to restroom with 2 person assist. Able to bear weight on both legs, Pt c/o nausea upon ambulation.Pt provided a UA.

## 2017-10-15 NOTE — ED Provider Notes (Signed)
Patient signed out to me by Dr. Jules SchickYelverton-had fallen at home and was unable to get up.  X-rays showed no evidence of hip fracture and screening labs were normal.  Urinalysis was pending.  Urinalysis shows no evidence of infection.  Patient was able to ambulate in the emergency department.  However, family member is very concerned about her going home since she lives alone.  She will be held in the ED department for evaluation by case management.  Results for orders placed or performed during the hospital encounter of 10/14/17  Rapid Strep Screen (Not at [facility])  Result Value Ref Range   Streptococcus, Group A Screen (Direct) NEGATIVE NEGATIVE  CBC with Differential/Platelet  Result Value Ref Range   WBC 9.1 4.0 - 10.5 K/uL   RBC 3.62 (L) 3.87 - 5.11 MIL/uL   Hemoglobin 11.2 (L) 12.0 - 15.0 g/dL   HCT 16.133.4 (L) 09.636.0 - 04.546.0 %   MCV 92.3 78.0 - 100.0 fL   MCH 30.9 26.0 - 34.0 pg   MCHC 33.5 30.0 - 36.0 g/dL   RDW 40.912.3 81.111.5 - 91.415.5 %   Platelets 253 150 - 400 K/uL   Neutrophils Relative % 87 %   Neutro Abs 7.8 (H) 1.7 - 7.7 K/uL   Lymphocytes Relative 4 %   Lymphs Abs 0.4 (L) 0.7 - 4.0 K/uL   Monocytes Relative 9 %   Monocytes Absolute 0.9 0.1 - 1.0 K/uL   Eosinophils Relative 0 %   Eosinophils Absolute 0.0 0.0 - 0.7 K/uL   Basophils Relative 0 %   Basophils Absolute 0.0 0.0 - 0.1 K/uL  Comprehensive metabolic panel  Result Value Ref Range   Sodium 128 (L) 135 - 145 mmol/L   Potassium 3.4 (L) 3.5 - 5.1 mmol/L   Chloride 89 (L) 101 - 111 mmol/L   CO2 26 22 - 32 mmol/L   Glucose, Bld 130 (H) 65 - 99 mg/dL   BUN 13 6 - 20 mg/dL   Creatinine, Ser 7.820.82 0.44 - 1.00 mg/dL   Calcium 9.3 8.9 - 95.610.3 mg/dL   Total Protein 7.4 6.5 - 8.1 g/dL   Albumin 3.9 3.5 - 5.0 g/dL   AST 29 15 - 41 U/L   ALT 20 14 - 54 U/L   Alkaline Phosphatase 57 38 - 126 U/L   Total Bilirubin 0.6 0.3 - 1.2 mg/dL   GFR calc non Af Amer >60 >60 mL/min   GFR calc Af Amer >60 >60 mL/min   Anion gap 13 5 - 15  Troponin I   Result Value Ref Range   Troponin I <0.03 <0.03 ng/mL  CK  Result Value Ref Range   Total CK 198 38 - 234 U/L  Urinalysis, Routine w reflex microscopic  Result Value Ref Range   Color, Urine STRAW (A) YELLOW   APPearance CLEAR CLEAR   Specific Gravity, Urine 1.020 1.005 - 1.030   pH 6.0 5.0 - 8.0   Glucose, UA NEGATIVE NEGATIVE mg/dL   Hgb urine dipstick NEGATIVE NEGATIVE   Bilirubin Urine NEGATIVE NEGATIVE   Ketones, ur NEGATIVE NEGATIVE mg/dL   Protein, ur 30 (A) NEGATIVE mg/dL   Nitrite NEGATIVE NEGATIVE   Leukocytes, UA NEGATIVE NEGATIVE  Urinalysis, Microscopic (reflex)  Result Value Ref Range   RBC / HPF 0-5 0 - 5 RBC/hpf   WBC, UA 0-5 0 - 5 WBC/hpf   Bacteria, UA FEW (A) NONE SEEN   Squamous Epithelial / LPF 0-5 (A) NONE SEEN   Dg Chest  2 View  Result Date: 10/14/2017 CLINICAL DATA:  84 year old female status post fall at home. Right hip pain. Cough. EXAM: CHEST  2 VIEW COMPARISON:  Chest radiographs 12/28/2013 and earlier. FINDINGS: Upright AP and lateral views of the chest. Chronic large lung volumes with increased AP dimension to the chest. Stable cardiac size and mediastinal contours. Mildly tortuous thoracic aorta with calcified atherosclerosis appears stable. Visualized tracheal air column is within normal limits. No pneumothorax, pulmonary edema, pleural effusion or confluent pulmonary opacity. Osteopenia. No acute osseous abnormality identified. Negative visible bowel gas pattern. IMPRESSION: 1. No acute cardiopulmonary abnormality or acute traumatic injury identified. 2.  Aortic Atherosclerosis (ICD10-I70.0). Electronically Signed   By: Odessa Fleming M.D.   On: 10/14/2017 22:22   Ct Head Wo Contrast  Result Date: 10/14/2017 CLINICAL DATA:  Status post fall to floor, with generalized weakness. EXAM: CT HEAD WITHOUT CONTRAST TECHNIQUE: Contiguous axial images were obtained from the base of the skull through the vertex without intravenous contrast. COMPARISON:  None. FINDINGS:  Brain: No evidence of acute infarction, hemorrhage, hydrocephalus, extra-axial collection or mass lesion / mass effect. Prominence of the ventricles and sulci reflects mild to moderate cortical volume loss. Mild cerebellar atrophy is noted. Scattered periventricular slight matter change likely reflects small vessel ischemic microangiopathy. A small chronic lacunar infarct is noted at the left thalamus. The brainstem and fourth ventricle are within normal limits. The cerebral hemispheres demonstrate grossly normal gray-white differentiation. No mass effect or midline shift is seen. Vascular: No hyperdense vessel or unexpected calcification. Skull: There is no evidence of fracture; visualized osseous structures are unremarkable in appearance. Sinuses/Orbits: The orbits are within normal limits. The paranasal sinuses and mastoid air cells are well-aerated. Other: No significant soft tissue abnormalities are seen. IMPRESSION: 1. No evidence of traumatic intracranial injury or fracture. 2. Mild to moderate cortical volume loss and scattered small vessel ischemic microangiopathy. 3. Small chronic lacunar infarct at the left thalamus. Electronically Signed   By: Roanna Raider M.D.   On: 10/14/2017 23:38   Dg Hip Unilat With Pelvis 2-3 Views Right  Result Date: 10/14/2017 CLINICAL DATA:  Status post fall backwards, with acute onset of right hip pain. Initial encounter. EXAM: DG HIP (WITH OR WITHOUT PELVIS) 2-3V RIGHT COMPARISON:  None. FINDINGS: There is no evidence of fracture or dislocation. The patient's left hip arthroplasty is unremarkable in appearance, though incompletely imaged on this study. There is no evidence of loosening. The proximal right femur appears intact. Degenerative change is noted along the lower lumbar spine. Mild sclerosis is seen at the sacroiliac joints. The visualized bowel gas pattern is grossly unremarkable in appearance. IMPRESSION: No evidence of fracture or dislocation. Electronically  Signed   By: Roanna Raider M.D.   On: 10/14/2017 22:21      Dione Booze, MD 10/15/17 [phone]

## 2017-10-15 NOTE — ED Provider Notes (Signed)
Case management consult obtained.  Home services for physical therapy and registered nurse were initiated.  These findings were discussed with the patient and her family.  Will discharge home.  She is hemodynamically stable at discharge.   Donnetta Hutchingook, Nanci Lakatos, MD 10/15/17 [phone]

## 2017-10-15 NOTE — Discharge Instructions (Signed)
Continue home medications.  Increase fluids.  Rest.  We have initiated recommendations per your interview with case management.

## 2017-10-15 NOTE — ED Notes (Signed)
Pt assisted to bed, lying on right side, shoes taken off, lights turned off, pt covered with 2 blankets and encouraged to try and get some sleep. Pt agreed and closed eyes.

## 2017-10-16 DIAGNOSIS — J209 Acute bronchitis, unspecified: Secondary | ICD-10-CM | POA: Diagnosis not present

## 2017-10-16 DIAGNOSIS — W19XXXD Unspecified fall, subsequent encounter: Secondary | ICD-10-CM | POA: Diagnosis not present

## 2017-10-16 DIAGNOSIS — I700 Atherosclerosis of aorta: Secondary | ICD-10-CM | POA: Diagnosis not present

## 2017-10-16 DIAGNOSIS — M6281 Muscle weakness (generalized): Secondary | ICD-10-CM | POA: Diagnosis not present

## 2017-10-16 DIAGNOSIS — Z7982 Long term (current) use of aspirin: Secondary | ICD-10-CM | POA: Diagnosis not present

## 2017-10-17 LAB — CULTURE, GROUP A STREP (THRC)

## 2017-10-18 DIAGNOSIS — Z7982 Long term (current) use of aspirin: Secondary | ICD-10-CM | POA: Diagnosis not present

## 2017-10-18 DIAGNOSIS — J209 Acute bronchitis, unspecified: Secondary | ICD-10-CM | POA: Diagnosis not present

## 2017-10-18 DIAGNOSIS — W19XXXD Unspecified fall, subsequent encounter: Secondary | ICD-10-CM | POA: Diagnosis not present

## 2017-10-18 DIAGNOSIS — M6281 Muscle weakness (generalized): Secondary | ICD-10-CM | POA: Diagnosis not present

## 2017-10-18 DIAGNOSIS — I700 Atherosclerosis of aorta: Secondary | ICD-10-CM | POA: Diagnosis not present

## 2017-10-22 DIAGNOSIS — Z6824 Body mass index (BMI) 24.0-24.9, adult: Secondary | ICD-10-CM | POA: Diagnosis not present

## 2017-10-22 DIAGNOSIS — I700 Atherosclerosis of aorta: Secondary | ICD-10-CM | POA: Diagnosis not present

## 2017-10-22 DIAGNOSIS — M6281 Muscle weakness (generalized): Secondary | ICD-10-CM | POA: Diagnosis not present

## 2017-10-22 DIAGNOSIS — J209 Acute bronchitis, unspecified: Secondary | ICD-10-CM | POA: Diagnosis not present

## 2017-10-22 DIAGNOSIS — W19XXXD Unspecified fall, subsequent encounter: Secondary | ICD-10-CM | POA: Diagnosis not present

## 2017-10-22 DIAGNOSIS — Z7982 Long term (current) use of aspirin: Secondary | ICD-10-CM | POA: Diagnosis not present

## 2017-10-23 ENCOUNTER — Ambulatory Visit: Payer: Medicare Other | Admitting: Cardiovascular Disease

## 2017-10-24 DIAGNOSIS — Z7982 Long term (current) use of aspirin: Secondary | ICD-10-CM | POA: Diagnosis not present

## 2017-10-24 DIAGNOSIS — M6281 Muscle weakness (generalized): Secondary | ICD-10-CM | POA: Diagnosis not present

## 2017-10-24 DIAGNOSIS — W19XXXD Unspecified fall, subsequent encounter: Secondary | ICD-10-CM | POA: Diagnosis not present

## 2017-10-24 DIAGNOSIS — J209 Acute bronchitis, unspecified: Secondary | ICD-10-CM | POA: Diagnosis not present

## 2017-10-24 DIAGNOSIS — I700 Atherosclerosis of aorta: Secondary | ICD-10-CM | POA: Diagnosis not present

## 2017-10-25 DIAGNOSIS — I700 Atherosclerosis of aorta: Secondary | ICD-10-CM | POA: Diagnosis not present

## 2017-10-25 DIAGNOSIS — W19XXXD Unspecified fall, subsequent encounter: Secondary | ICD-10-CM | POA: Diagnosis not present

## 2017-10-25 DIAGNOSIS — M6281 Muscle weakness (generalized): Secondary | ICD-10-CM | POA: Diagnosis not present

## 2017-10-25 DIAGNOSIS — Z7982 Long term (current) use of aspirin: Secondary | ICD-10-CM | POA: Diagnosis not present

## 2017-10-25 DIAGNOSIS — J209 Acute bronchitis, unspecified: Secondary | ICD-10-CM | POA: Diagnosis not present

## 2017-10-30 DIAGNOSIS — Z7982 Long term (current) use of aspirin: Secondary | ICD-10-CM | POA: Diagnosis not present

## 2017-10-30 DIAGNOSIS — W19XXXD Unspecified fall, subsequent encounter: Secondary | ICD-10-CM | POA: Diagnosis not present

## 2017-10-30 DIAGNOSIS — I700 Atherosclerosis of aorta: Secondary | ICD-10-CM | POA: Diagnosis not present

## 2017-10-30 DIAGNOSIS — M6281 Muscle weakness (generalized): Secondary | ICD-10-CM | POA: Diagnosis not present

## 2017-10-30 DIAGNOSIS — J209 Acute bronchitis, unspecified: Secondary | ICD-10-CM | POA: Diagnosis not present

## 2017-10-31 DIAGNOSIS — I700 Atherosclerosis of aorta: Secondary | ICD-10-CM | POA: Diagnosis not present

## 2017-10-31 DIAGNOSIS — Z7982 Long term (current) use of aspirin: Secondary | ICD-10-CM | POA: Diagnosis not present

## 2017-10-31 DIAGNOSIS — W19XXXD Unspecified fall, subsequent encounter: Secondary | ICD-10-CM | POA: Diagnosis not present

## 2017-10-31 DIAGNOSIS — M6281 Muscle weakness (generalized): Secondary | ICD-10-CM | POA: Diagnosis not present

## 2017-10-31 DIAGNOSIS — J209 Acute bronchitis, unspecified: Secondary | ICD-10-CM | POA: Diagnosis not present

## 2017-11-01 DIAGNOSIS — J209 Acute bronchitis, unspecified: Secondary | ICD-10-CM | POA: Diagnosis not present

## 2017-11-01 DIAGNOSIS — I700 Atherosclerosis of aorta: Secondary | ICD-10-CM | POA: Diagnosis not present

## 2017-11-01 DIAGNOSIS — W19XXXD Unspecified fall, subsequent encounter: Secondary | ICD-10-CM | POA: Diagnosis not present

## 2017-11-01 DIAGNOSIS — Z7982 Long term (current) use of aspirin: Secondary | ICD-10-CM | POA: Diagnosis not present

## 2017-11-01 DIAGNOSIS — M6281 Muscle weakness (generalized): Secondary | ICD-10-CM | POA: Diagnosis not present

## 2017-11-04 DIAGNOSIS — J209 Acute bronchitis, unspecified: Secondary | ICD-10-CM | POA: Diagnosis not present

## 2017-11-04 DIAGNOSIS — I700 Atherosclerosis of aorta: Secondary | ICD-10-CM | POA: Diagnosis not present

## 2017-11-04 DIAGNOSIS — M6281 Muscle weakness (generalized): Secondary | ICD-10-CM | POA: Diagnosis not present

## 2017-11-04 DIAGNOSIS — W19XXXD Unspecified fall, subsequent encounter: Secondary | ICD-10-CM | POA: Diagnosis not present

## 2017-11-04 DIAGNOSIS — Z7982 Long term (current) use of aspirin: Secondary | ICD-10-CM | POA: Diagnosis not present

## 2017-11-07 DIAGNOSIS — M6281 Muscle weakness (generalized): Secondary | ICD-10-CM | POA: Diagnosis not present

## 2017-11-07 DIAGNOSIS — Z7982 Long term (current) use of aspirin: Secondary | ICD-10-CM | POA: Diagnosis not present

## 2017-11-07 DIAGNOSIS — I700 Atherosclerosis of aorta: Secondary | ICD-10-CM | POA: Diagnosis not present

## 2017-11-07 DIAGNOSIS — J209 Acute bronchitis, unspecified: Secondary | ICD-10-CM | POA: Diagnosis not present

## 2017-11-07 DIAGNOSIS — W19XXXD Unspecified fall, subsequent encounter: Secondary | ICD-10-CM | POA: Diagnosis not present

## 2017-11-11 DIAGNOSIS — W19XXXD Unspecified fall, subsequent encounter: Secondary | ICD-10-CM | POA: Diagnosis not present

## 2017-11-11 DIAGNOSIS — M6281 Muscle weakness (generalized): Secondary | ICD-10-CM | POA: Diagnosis not present

## 2017-11-11 DIAGNOSIS — I700 Atherosclerosis of aorta: Secondary | ICD-10-CM | POA: Diagnosis not present

## 2017-11-11 DIAGNOSIS — Z7982 Long term (current) use of aspirin: Secondary | ICD-10-CM | POA: Diagnosis not present

## 2017-11-11 DIAGNOSIS — J209 Acute bronchitis, unspecified: Secondary | ICD-10-CM | POA: Diagnosis not present

## 2017-11-13 DIAGNOSIS — W19XXXD Unspecified fall, subsequent encounter: Secondary | ICD-10-CM | POA: Diagnosis not present

## 2017-11-13 DIAGNOSIS — Z7982 Long term (current) use of aspirin: Secondary | ICD-10-CM | POA: Diagnosis not present

## 2017-11-13 DIAGNOSIS — I700 Atherosclerosis of aorta: Secondary | ICD-10-CM | POA: Diagnosis not present

## 2017-11-13 DIAGNOSIS — M6281 Muscle weakness (generalized): Secondary | ICD-10-CM | POA: Diagnosis not present

## 2017-11-13 DIAGNOSIS — J209 Acute bronchitis, unspecified: Secondary | ICD-10-CM | POA: Diagnosis not present

## 2017-11-29 ENCOUNTER — Other Ambulatory Visit: Payer: Self-pay

## 2017-11-29 ENCOUNTER — Encounter (HOSPITAL_COMMUNITY): Payer: Self-pay | Admitting: Emergency Medicine

## 2017-11-29 ENCOUNTER — Ambulatory Visit: Payer: Medicare Other | Admitting: Cardiovascular Disease

## 2017-11-29 ENCOUNTER — Emergency Department (HOSPITAL_COMMUNITY): Payer: Medicare Other

## 2017-11-29 ENCOUNTER — Emergency Department (HOSPITAL_COMMUNITY)
Admission: EM | Admit: 2017-11-29 | Discharge: 2017-11-29 | Disposition: A | Discharge status: Home / Self Care | Payer: Medicare Other | Attending: Emergency Medicine | Admitting: Emergency Medicine

## 2017-11-29 DIAGNOSIS — Z7982 Long term (current) use of aspirin: Secondary | ICD-10-CM | POA: Insufficient documentation

## 2017-11-29 DIAGNOSIS — Z471 Aftercare following joint replacement surgery: Secondary | ICD-10-CM | POA: Diagnosis not present

## 2017-11-29 DIAGNOSIS — S7012XA Contusion of left thigh, initial encounter: Secondary | ICD-10-CM | POA: Diagnosis not present

## 2017-11-29 DIAGNOSIS — S61412A Laceration without foreign body of left hand, initial encounter: Secondary | ICD-10-CM

## 2017-11-29 DIAGNOSIS — Y999 Unspecified external cause status: Secondary | ICD-10-CM | POA: Insufficient documentation

## 2017-11-29 DIAGNOSIS — R52 Pain, unspecified: Secondary | ICD-10-CM | POA: Diagnosis not present

## 2017-11-29 DIAGNOSIS — Z87891 Personal history of nicotine dependence: Secondary | ICD-10-CM | POA: Diagnosis not present

## 2017-11-29 DIAGNOSIS — M25552 Pain in left hip: Secondary | ICD-10-CM | POA: Diagnosis not present

## 2017-11-29 DIAGNOSIS — I10 Essential (primary) hypertension: Secondary | ICD-10-CM | POA: Diagnosis not present

## 2017-11-29 DIAGNOSIS — Y92512 Supermarket, store or market as the place of occurrence of the external cause: Secondary | ICD-10-CM | POA: Diagnosis not present

## 2017-11-29 DIAGNOSIS — S60512A Abrasion of left hand, initial encounter: Secondary | ICD-10-CM | POA: Diagnosis not present

## 2017-11-29 DIAGNOSIS — S7002XA Contusion of left hip, initial encounter: Secondary | ICD-10-CM | POA: Insufficient documentation

## 2017-11-29 DIAGNOSIS — Z96642 Presence of left artificial hip joint: Secondary | ICD-10-CM | POA: Diagnosis not present

## 2017-11-29 DIAGNOSIS — W01198A Fall on same level from slipping, tripping and stumbling with subsequent striking against other object, initial encounter: Secondary | ICD-10-CM | POA: Diagnosis not present

## 2017-11-29 DIAGNOSIS — Z79899 Other long term (current) drug therapy: Secondary | ICD-10-CM | POA: Diagnosis not present

## 2017-11-29 DIAGNOSIS — Y9389 Activity, other specified: Secondary | ICD-10-CM | POA: Insufficient documentation

## 2017-11-29 DIAGNOSIS — Z23 Encounter for immunization: Secondary | ICD-10-CM | POA: Insufficient documentation

## 2017-11-29 DIAGNOSIS — S79912A Unspecified injury of left hip, initial encounter: Secondary | ICD-10-CM | POA: Diagnosis present

## 2017-11-29 MED ORDER — TETANUS-DIPHTH-ACELL PERTUSSIS 5-2.5-18.5 LF-MCG/0.5 IM SUSP
0.5000 mL | Freq: Once | INTRAMUSCULAR | Status: AC
  Administered 2017-11-29: 0.5 mL via INTRAMUSCULAR
  Filled 2017-11-29: qty 0.5

## 2017-11-29 MED ORDER — ACETAMINOPHEN 325 MG PO TABS
650.0000 mg | ORAL_TABLET | Freq: Once | ORAL | Status: AC
  Administered 2017-11-29: 650 mg via ORAL
  Filled 2017-11-29: qty 2

## 2017-11-29 NOTE — ED Triage Notes (Signed)
Per EMS, patient fell at [employer] parking [place]. Pt reports pain to left hip and skin tear to left hand. Patient is alert and oriented on arrival. Pt states previous left hip replacement.

## 2017-11-29 NOTE — ED Provider Notes (Signed)
[facility] EMERGENCY DEPARTMENT Provider Note   CSN: [id number] Arrival date & time: 11/29/17  1704     History   Chief Complaint Chief Complaint  Patient presents with  . Hip Pain    HPI Whitney [name] is a 84 y.o. female.  HPI  84 year old female presents with a fall.  She was at the grocery store prior to arrival and when walking out tripped when her foot got caught in a crack.  She landed on her left side.  She did not hit her head or lose consciousness.  Denies headache, chest pain, abdominal pain.  She laid on her left lateral hip and has had pain.  She has had her left hip previously replaced.  She was able to get up and walk with assistance but only took a few steps.  She has not taken anything for the pain.  She also has a small skin tear to her left hand but otherwise denies pain in her hand.  Unknown last tetanus immunization.  Past Medical History:  Diagnosis Date  . Hyperlipidemia   . Hypertension   . Tobacco abuse    discontinued tobacco abuse 12/25/13    Patient Active Problem List   Diagnosis Date Noted  . Unspecified essential hypertension 01/04/2014  . Senile osteoporosis 01/04/2014  . Acute blood loss anemia 12/28/2013  . Acute respiratory failure with hypoxia (HCC) 12/28/2013  . Fracture of femoral neck, left, closed (HCC) 12/25/2013  . Tobacco abuse 12/25/2013  . Essential hypertension 08/05/2013  . Hyperlipidemia 08/05/2013    Past Surgical History:  Procedure Laterality Date  . HIP ARTHROPLASTY Left 12/26/2013   Procedure: ARTHROPLASTY BIPOLAR HIP LEFT;  Surgeon: Darreld McleanWayne Keeling, MD;  Location: [facility];  Service: Orthopedics;  Laterality: Left;     OB History   None      Home Medications    Prior to Admission medications   Medication Sig Start Date End Date Taking? Authorizing Provider  albuterol-ipratropium (COMBIVENT) 18-103 MCG/ACT inhaler Inhale 2 puffs into the lungs every 4 (four) hours as needed for wheezing or shortness of  breath. 12/30/13   Elliot CousinFisher, Denise, MD  aspirin EC 81 MG tablet Take 81 mg by mouth every morning.     [provider]  atorvastatin (LIPITOR) 20 MG tablet Take 10 mg by mouth at bedtime.     [provider]  BENICAR 40 MG tablet Take 40 mg by mouth daily. 07/23/13   [provider]  Calcium Carb-Cholecalciferol (CALCIUM 500 +D) 500-400 MG-UNIT TABS Take 1 tablet by mouth at bedtime.     [provider]  cloNIDine (CATAPRES) 0.1 MG tablet Take 0.1-0.2 mg by mouth 2 (two) times daily. Two tablets taken in the morning and one tablet at super 07/14/13   [provider]  diazepam (VALIUM) 5 MG tablet Take 1 tablet (5 mg total) by mouth daily as needed for anxiety. 12/31/13   Sharon SellerEubanks, Jessica K, NP  doxazosin (CARDURA) 2 MG tablet Take 2 mg by mouth at bedtime. 07/14/13   [provider]  hydrochlorothiazide (HYDRODIURIL) 25 MG tablet Take 25 mg by mouth every morning.  06/22/13   [provider]  metoprolol succinate (TOPROL-XL) 100 MG 24 hr tablet Take 100 mg by mouth every morning.  07/14/13   [provider]  Omega-3 Fatty Acids (FISH OIL) 1000 MG CAPS Take 1,000 mg by mouth at bedtime.     [provider]  potassium chloride (K-DUR) 10 MEQ tablet Take 1 tablet (  10 mEq total) by mouth daily. 12/30/13   Elliot Cousin, MD    Family History Family History  Problem Relation Age of Onset  . Diabetes Sister     Social History Social History   Tobacco Use  . Smoking status: Former Smoker    Packs/day: 1.00    Years: 50.00    Pack years: 50.00    Types: Cigarettes  . Smokeless tobacco: Never Used  Substance Use Topics  . Alcohol use: No    Alcohol/week: 0.0 oz  . Drug use: No     Allergies   Prednisone   Review of Systems Review of Systems  Musculoskeletal: Positive for arthralgias. Negative for joint swelling.  Skin: Positive for wound.  Neurological: Negative for headaches.  All other systems reviewed  and are negative.    Physical Exam Updated Vital Signs BP (!) 181/76   Pulse 62   Temp 97.9 F (36.6 C) (Oral)   Resp 18   Ht 5\' 4"  (1.626 m)   Wt 63.5 kg (140 lb)   SpO2 96%   BMI 24.03 kg/m   Physical Exam  Constitutional: She is oriented to person, place, and time. She appears well-developed and well-nourished.  HENT:  Head: Normocephalic and atraumatic.  Right Ear: External ear normal.  Left Ear: External ear normal.  Nose: Nose normal.  Eyes: Right eye exhibits no discharge. Left eye exhibits no discharge.  Cardiovascular: Normal rate, regular rhythm and intact distal pulses.  Pulses:      Radial pulses are 2+ on the left side.       Dorsalis pedis pulses are 2+ on the left side.  Pulmonary/Chest: Effort normal.  Abdominal: Soft. She exhibits no distension.  Musculoskeletal:       Left hip: She exhibits tenderness. She exhibits normal range of motion.       Lumbar back: She exhibits no tenderness.       Left hand: She exhibits laceration. She exhibits normal range of motion, no tenderness and no swelling.       Hands:      Legs: Neurological: She is alert and oriented to person, place, and time.  Skin: Skin is warm and dry.  Nursing note and vitals reviewed.    ED Treatments / Results  Labs (all labs ordered are listed, but only abnormal results are displayed) Labs Reviewed - No data to display  EKG None  Radiology Dg Hip Unilat W Or Wo Pelvis 2-3 Views Left  Result Date: 11/29/2017 CLINICAL DATA:  Left hip pain.  Status post fall. EXAM: DG HIP (WITH OR WITHOUT PELVIS) 2-3V LEFT COMPARISON:  None. FINDINGS: Left hip arthroplasty without hardware failure or complication. No acute fracture or dislocation. No right hip fracture or dislocation. Generalized osteopenia. No aggressive osseous lesion. Lower lumbar spine spondylosis. IMPRESSION: Left hip arthroplasty.  No acute osseous injury of the left hip. Electronically Signed   By: Elige Ko   On: 11/29/2017  18:28    Procedures Procedures (including critical care time)  Medications Ordered in ED Medications  acetaminophen (TYLENOL) tablet 650 mg (650 mg Oral Given 11/29/17 1754)  Tdap (BOOSTRIX) injection 0.5 mL (0.5 mLs Intramuscular Given 11/29/17 1756)     Initial Impression / Assessment and Plan / ED Course  I have reviewed the triage vital signs and the nursing notes.  Pertinent labs & imaging results that were available during my care of the patient were reviewed by me and considered in my medical decision making (see chart  for details).     Patient presents after mechanical fall.  Appears to have a contusion of the soft tissues near the hip.  X-ray is unremarkable.  She also has a very small skin tear to her left hand but no significant swelling or tenderness to warrant x-ray imaging.  She is able to walk with minimal assistance.  She is able to bear weight and has a mild limp but normally uses a walker and I have encouraged her to use this at home.  I highly doubt a periprosthetic fracture or occult fracture.  Given she can ambulate I think she is stable for discharge home with Tylenol and ice.  Follow-up with PCP.  Discussed return precautions.  Final Clinical Impressions(s) / ED Diagnoses   Final diagnoses:  Contusion of left hip and thigh, initial encounter  Skin tear of left hand without complication, initial encounter    ED Discharge Orders    None       Pricilla Loveless, MD 11/29/17 1914

## 2017-12-24 DIAGNOSIS — Z1389 Encounter for screening for other disorder: Secondary | ICD-10-CM | POA: Diagnosis not present

## 2017-12-24 DIAGNOSIS — I10 Essential (primary) hypertension: Secondary | ICD-10-CM | POA: Diagnosis not present

## 2017-12-24 DIAGNOSIS — G894 Chronic pain syndrome: Secondary | ICD-10-CM | POA: Diagnosis not present

## 2017-12-24 DIAGNOSIS — Z6823 Body mass index (BMI) 23.0-23.9, adult: Secondary | ICD-10-CM | POA: Diagnosis not present

## 2018-02-16 IMAGING — DX DG HIP (WITH OR WITHOUT PELVIS) 2-3V*R*
3 series · 3 of 3 positions shown · non-contrast
Comparison: None.

CLINICAL DATA: Status post fall backwards, with acute onset of
right hip pain. Initial encounter.

EXAM:
DG HIP (WITH OR WITHOUT PELVIS) 2-3V RIGHT

[hip ap (1 of 2)]
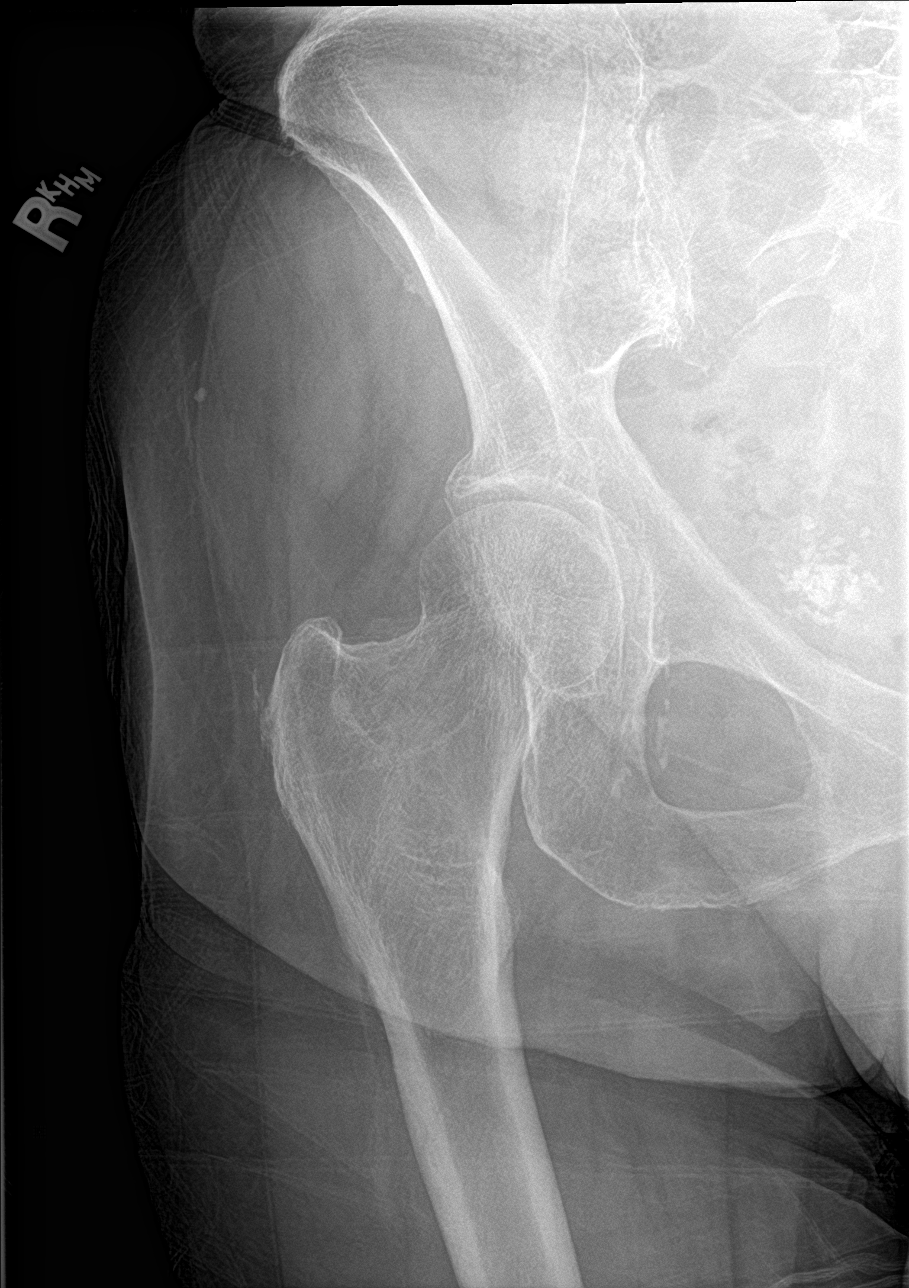

[hip lat]
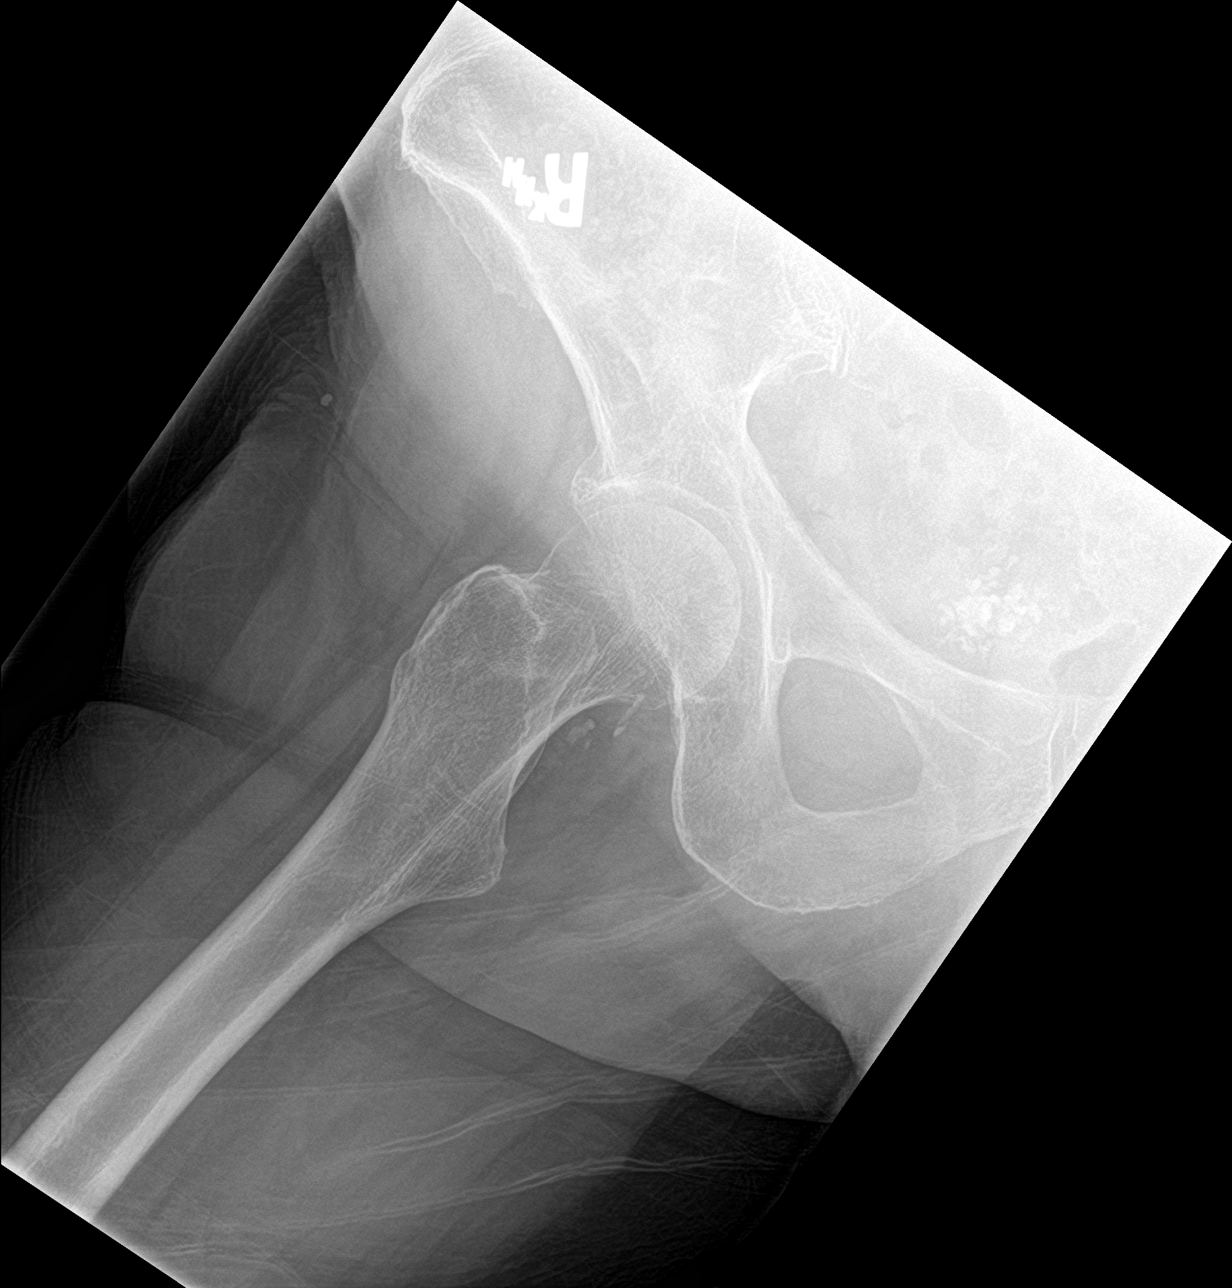

[hip ap (2 of 2)]
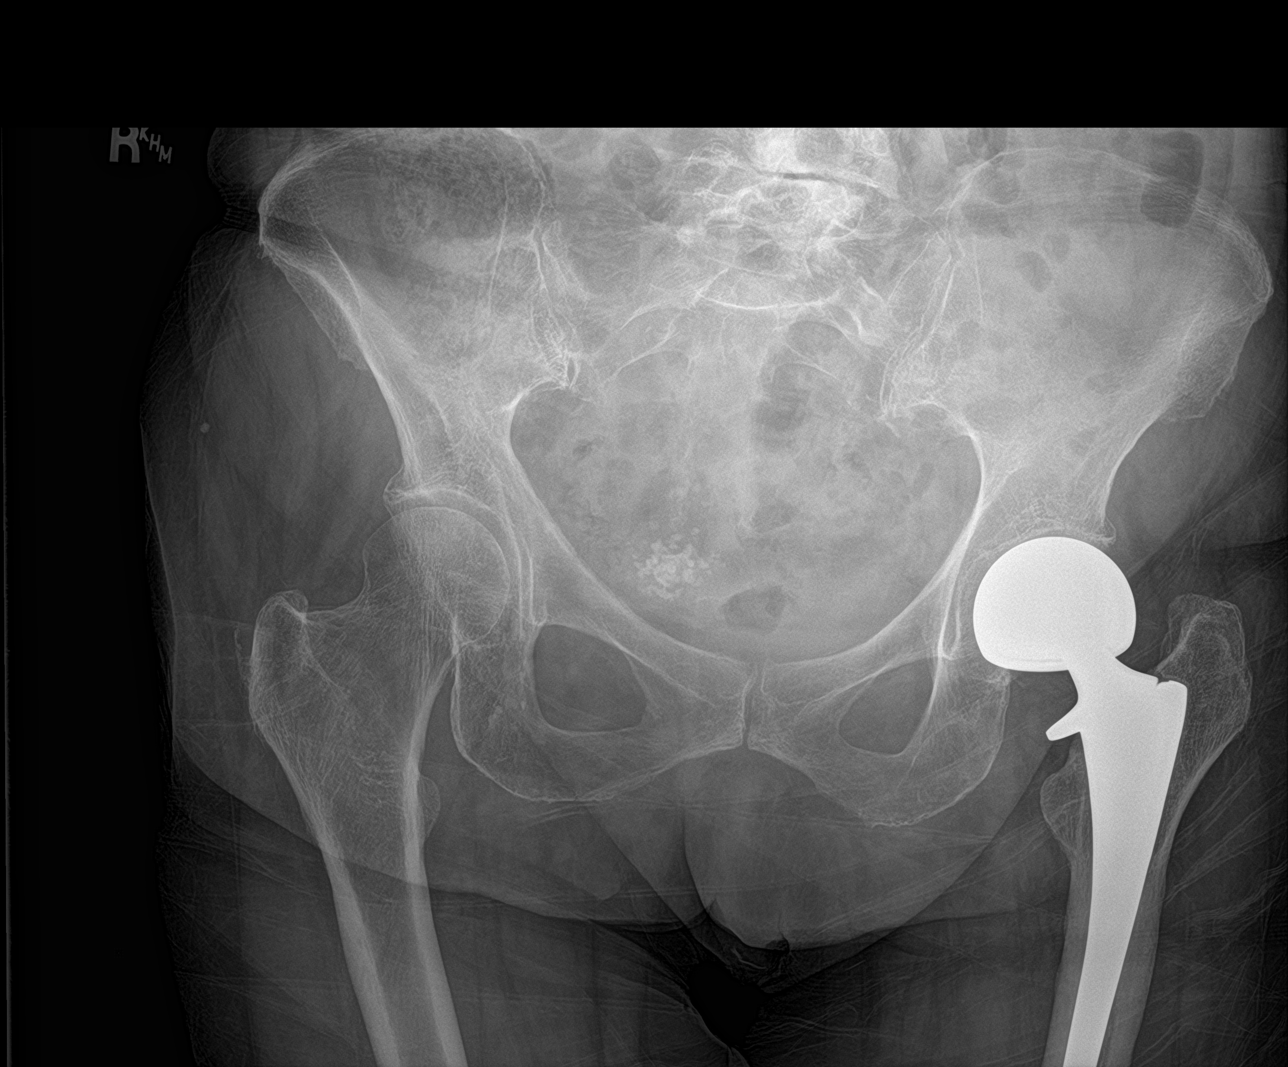

[3 of 3 positions shown; findings below may reference images not displayed]

FINDINGS: There is no evidence of fracture or dislocation. The patient's left
hip arthroplasty is unremarkable in appearance, though incompletely
imaged on this study. There is no evidence of loosening. The
proximal right femur appears intact. Degenerative change is noted
along the lower lumbar spine. Mild sclerosis is seen at the
sacroiliac joints.

The visualized bowel gas pattern is grossly unremarkable in
appearance.
IMPRESSION: No evidence of fracture or dislocation.

## 2018-02-16 IMAGING — CT CT HEAD W/O CM
4 series · 16 of 47 positions shown, 18 images · non-contrast
Comparison: None.

CLINICAL DATA: Status post fall to floor, with generalized
weakness.

EXAM:
CT HEAD WITHOUT CONTRAST
TECHNIQUE: Contiguous axial images were obtained from the base of the skull
through the vertex without intravenous contrast.

[Series 2: head trauma wo · axial · 0.45mm/px · z∈[-611,-496]mm · 7 of 31 slices shown, 9 images]
[im 4/31  brain]
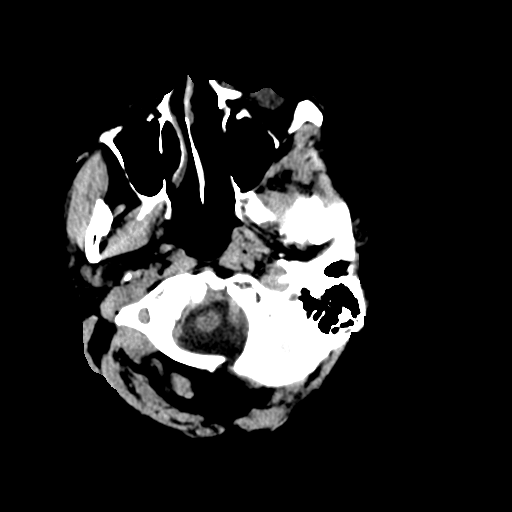
[im 4/31  bone]
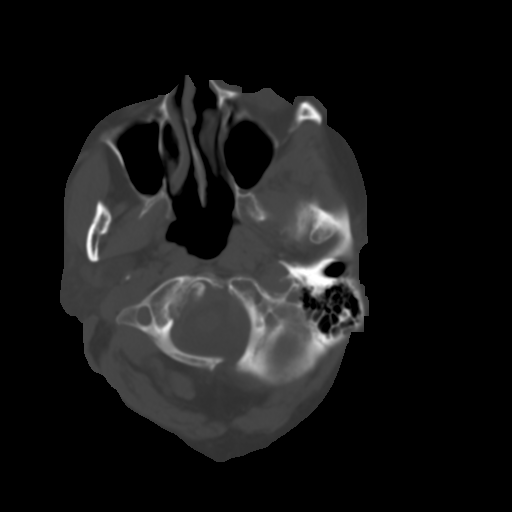
[im 8/31  brain]
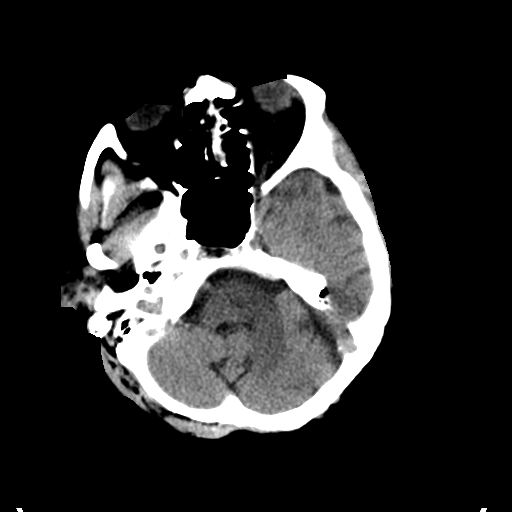
[im 12/31  brain]
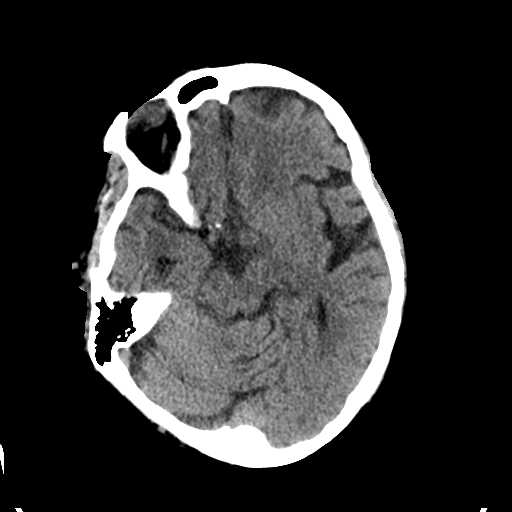
[im 16/31  brain]
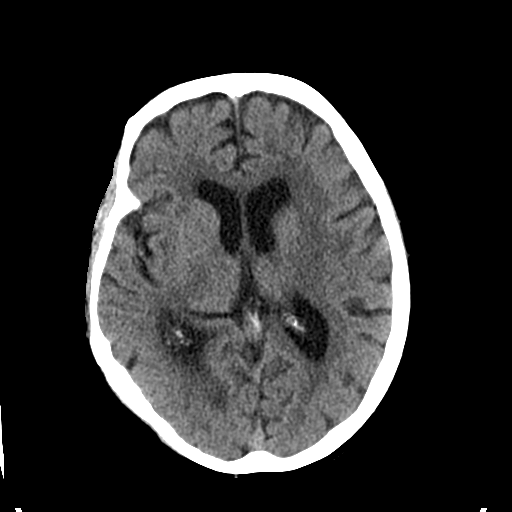
[im 19/31  brain]
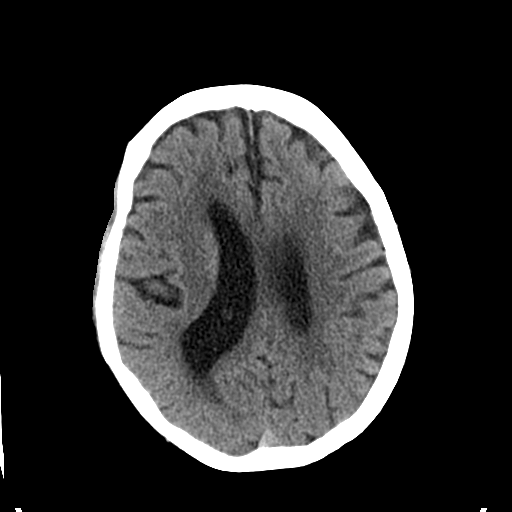
[im 19/31  bone]
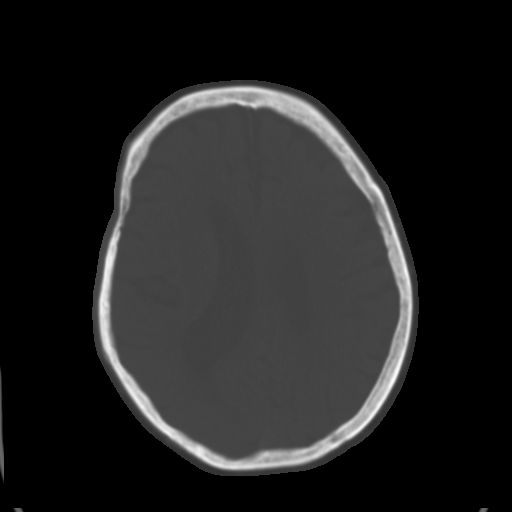
[im 23/31  brain]
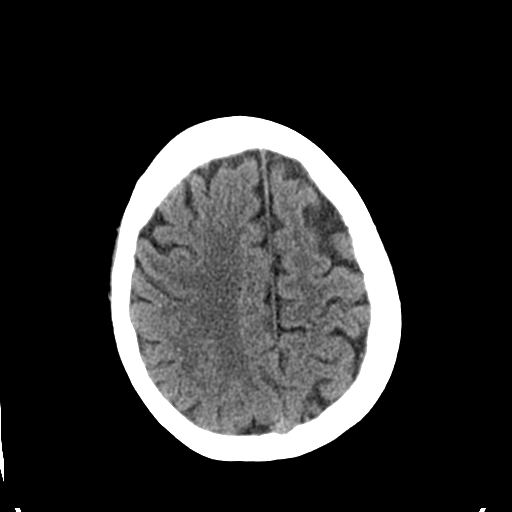
[im 27/31  brain]
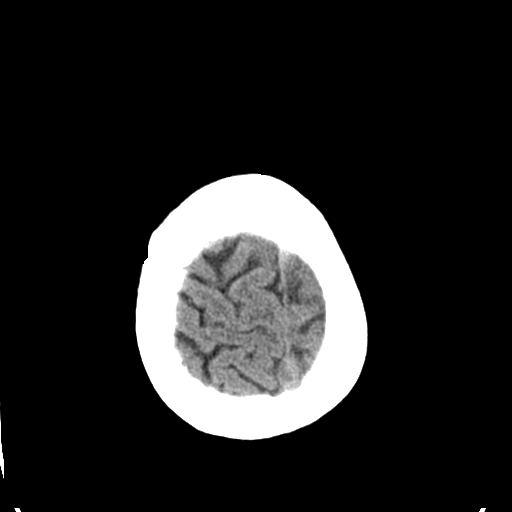

[Series 3: head bone · axial · 0.45mm/px · z∈[-612,-580]mm · 3 of 78 slices shown]
[im 8/78  bone]
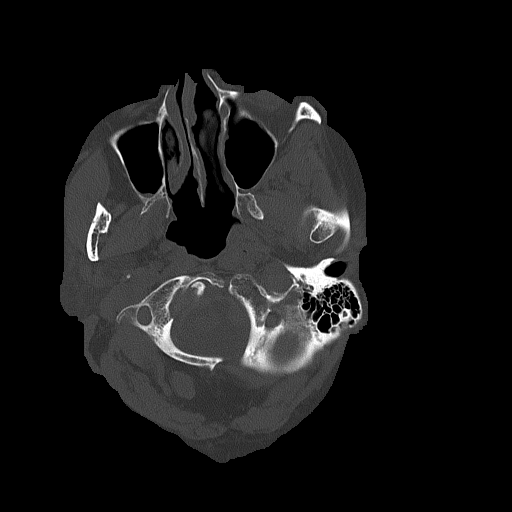
[im 16/78  bone]
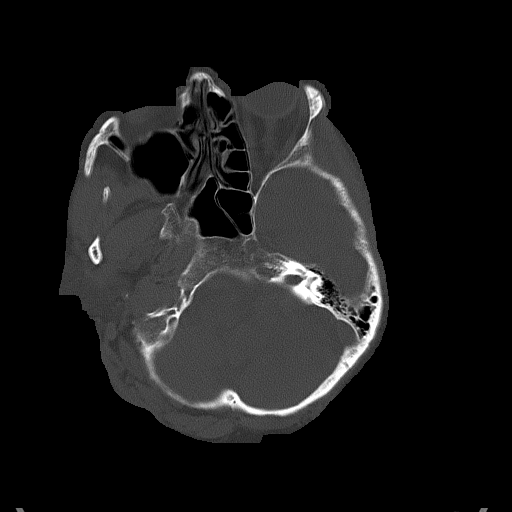
[im 24/78  bone]
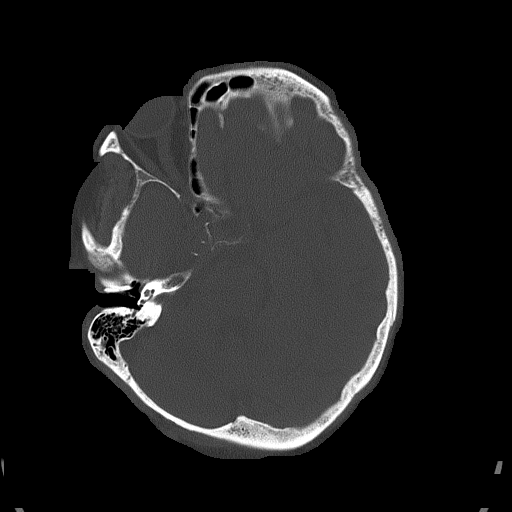

[Series 4: coronal soft tissue · coronal · 0.31mm/px · 3 of 66 slices shown]
[im 22/66  brain]
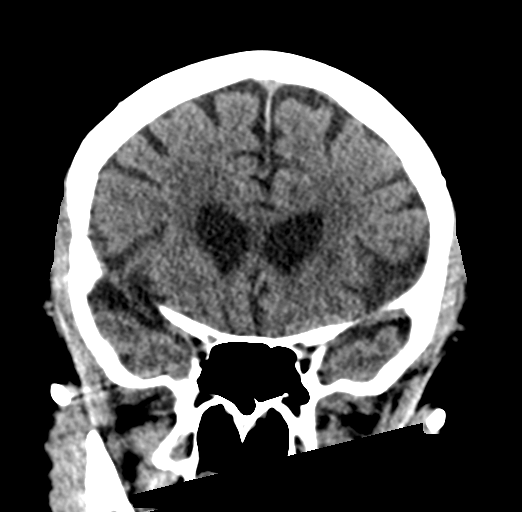
[im 29/66  brain]
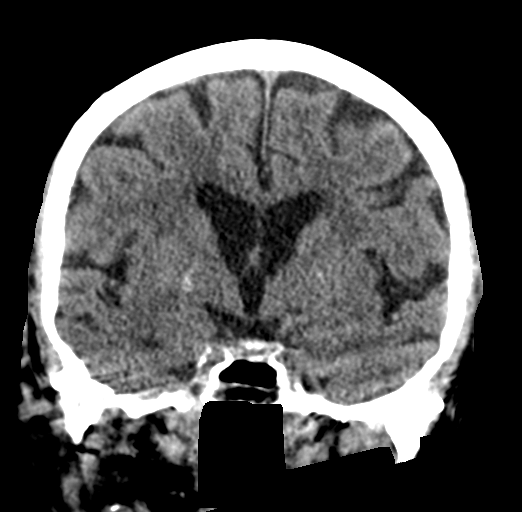
[im 37/66  brain]
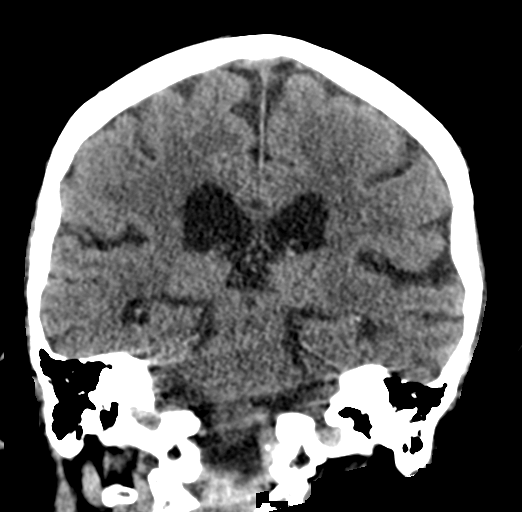

[Series 5: sagittal soft tissue · sagittal · 0.30mm/px · 3 of 51 slices shown]
[im 21/51  brain]
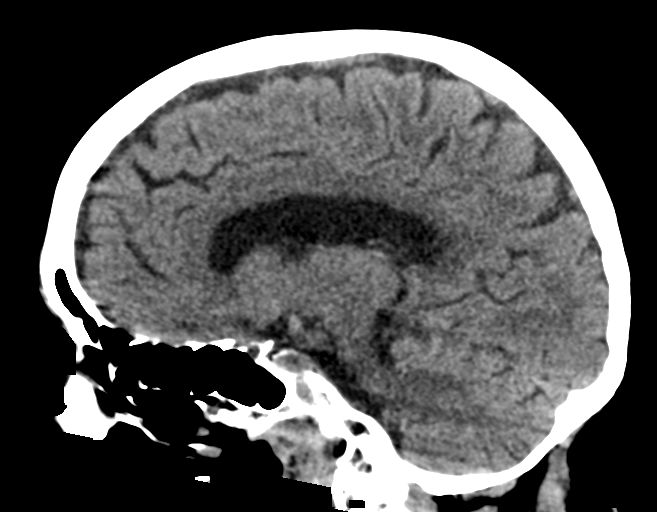
[im 26/51  brain]
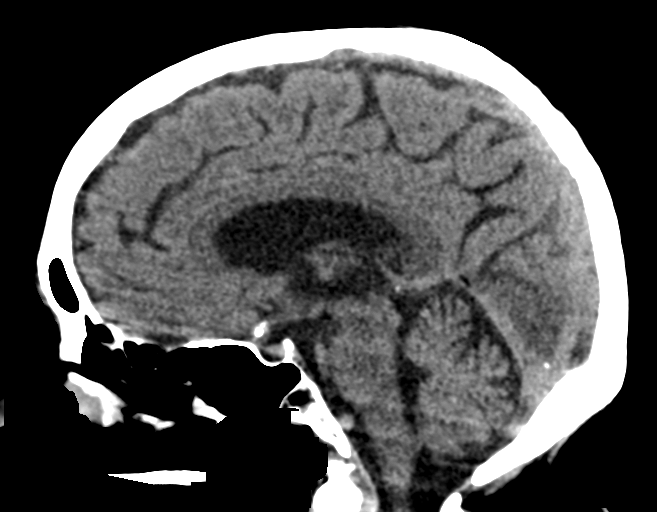
[im 30/51  brain]
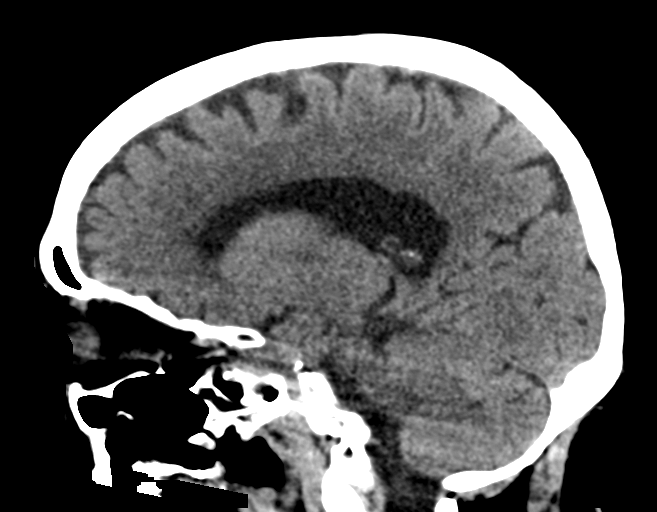

[16 of 47 positions shown; findings below may reference images not displayed]

FINDINGS: Brain: No evidence of acute infarction, hemorrhage, hydrocephalus,
extra-axial collection or mass lesion / mass effect.

Prominence of the ventricles and sulci reflects mild to moderate
cortical volume loss. Mild cerebellar atrophy is noted. Scattered
periventricular slight matter change likely reflects small vessel
ischemic microangiopathy. A small chronic lacunar infarct is noted
at the left thalamus.

The brainstem and fourth ventricle are within normal limits. The
cerebral hemispheres demonstrate grossly normal gray-white
differentiation. No mass effect or midline shift is seen.

Vascular: No hyperdense vessel or unexpected calcification.

Skull: There is no evidence of fracture; visualized osseous
structures are unremarkable in appearance.

Sinuses/Orbits: The orbits are within normal limits. The paranasal
sinuses and mastoid air cells are well-aerated.

Other: No significant soft tissue abnormalities are seen.
IMPRESSION: 1. No evidence of traumatic intracranial injury or fracture.
2. Mild to moderate cortical volume loss and scattered small vessel
ischemic microangiopathy.
3. Small chronic lacunar infarct at the left thalamus.

## 2018-03-19 DIAGNOSIS — Z0000 Encounter for general adult medical examination without abnormal findings: Secondary | ICD-10-CM | POA: Diagnosis not present

## 2018-03-19 DIAGNOSIS — Z6823 Body mass index (BMI) 23.0-23.9, adult: Secondary | ICD-10-CM | POA: Diagnosis not present

## 2018-03-27 DIAGNOSIS — E119 Type 2 diabetes mellitus without complications: Secondary | ICD-10-CM | POA: Diagnosis not present

## 2018-03-27 DIAGNOSIS — Z0000 Encounter for general adult medical examination without abnormal findings: Secondary | ICD-10-CM | POA: Diagnosis not present

## 2018-03-27 DIAGNOSIS — R7309 Other abnormal glucose: Secondary | ICD-10-CM | POA: Diagnosis not present

## 2018-03-27 DIAGNOSIS — Z1389 Encounter for screening for other disorder: Secondary | ICD-10-CM | POA: Diagnosis not present

## 2018-03-27 DIAGNOSIS — E782 Mixed hyperlipidemia: Secondary | ICD-10-CM | POA: Diagnosis not present

## 2018-04-28 DIAGNOSIS — I739 Peripheral vascular disease, unspecified: Secondary | ICD-10-CM | POA: Diagnosis not present

## 2018-04-28 DIAGNOSIS — L600 Ingrowing nail: Secondary | ICD-10-CM | POA: Diagnosis not present

## 2018-04-28 DIAGNOSIS — L03032 Cellulitis of left toe: Secondary | ICD-10-CM | POA: Diagnosis not present

## 2018-04-28 DIAGNOSIS — L03031 Cellulitis of right toe: Secondary | ICD-10-CM | POA: Diagnosis not present

## 2018-06-13 DIAGNOSIS — Z23 Encounter for immunization: Secondary | ICD-10-CM | POA: Diagnosis not present

## 2018-06-24 DIAGNOSIS — H04123 Dry eye syndrome of bilateral lacrimal glands: Secondary | ICD-10-CM | POA: Diagnosis not present

## 2018-06-24 DIAGNOSIS — H01022 Squamous blepharitis right lower eyelid: Secondary | ICD-10-CM | POA: Diagnosis not present

## 2018-06-24 DIAGNOSIS — H01024 Squamous blepharitis left upper eyelid: Secondary | ICD-10-CM | POA: Diagnosis not present

## 2018-06-24 DIAGNOSIS — H01025 Squamous blepharitis left lower eyelid: Secondary | ICD-10-CM | POA: Diagnosis not present

## 2018-06-24 DIAGNOSIS — H01021 Squamous blepharitis right upper eyelid: Secondary | ICD-10-CM | POA: Diagnosis not present

## 2018-07-28 DIAGNOSIS — Z6823 Body mass index (BMI) 23.0-23.9, adult: Secondary | ICD-10-CM | POA: Diagnosis not present

## 2018-07-28 DIAGNOSIS — I10 Essential (primary) hypertension: Secondary | ICD-10-CM | POA: Diagnosis not present

## 2018-07-28 DIAGNOSIS — S72002S Fracture of unspecified part of neck of left femur, sequela: Secondary | ICD-10-CM | POA: Diagnosis not present

## 2018-07-28 DIAGNOSIS — G894 Chronic pain syndrome: Secondary | ICD-10-CM | POA: Diagnosis not present

## 2018-11-20 DIAGNOSIS — I10 Essential (primary) hypertension: Secondary | ICD-10-CM | POA: Diagnosis not present

## 2018-11-20 DIAGNOSIS — G894 Chronic pain syndrome: Secondary | ICD-10-CM | POA: Diagnosis not present

## 2019-02-03 DIAGNOSIS — Z1389 Encounter for screening for other disorder: Secondary | ICD-10-CM | POA: Diagnosis not present

## 2019-02-03 DIAGNOSIS — Z0001 Encounter for general adult medical examination with abnormal findings: Secondary | ICD-10-CM | POA: Diagnosis not present

## 2019-03-23 DIAGNOSIS — Z6824 Body mass index (BMI) 24.0-24.9, adult: Secondary | ICD-10-CM | POA: Diagnosis not present

## 2019-03-23 DIAGNOSIS — G894 Chronic pain syndrome: Secondary | ICD-10-CM | POA: Diagnosis not present

## 2019-03-30 ENCOUNTER — Other Ambulatory Visit: Payer: Self-pay

## 2019-03-30 NOTE — Patient Outreach (Signed)
[facility]) Care Management  03/30/2019  Whitney [name] Jun 18, 1930 [id number]   Medication Adherence call to Whitney [name] Hippa Identifiers Verify spoke with patient she is past due on Atorvastatin 20 mg patient explain she is only taking 1/2 tablet daily and has plenty at this time. Whitney [name] is showing past due under [place].   [place] Management Direct Dial [phone]  Fax [phone] [name]

## 2019-03-31 DIAGNOSIS — R7309 Other abnormal glucose: Secondary | ICD-10-CM | POA: Diagnosis not present

## 2019-03-31 DIAGNOSIS — Z0000 Encounter for general adult medical examination without abnormal findings: Secondary | ICD-10-CM | POA: Diagnosis not present

## 2019-03-31 DIAGNOSIS — E782 Mixed hyperlipidemia: Secondary | ICD-10-CM | POA: Diagnosis not present

## 2019-03-31 DIAGNOSIS — I10 Essential (primary) hypertension: Secondary | ICD-10-CM | POA: Diagnosis not present

## 2019-03-31 DIAGNOSIS — Z13220 Encounter for screening for lipoid disorders: Secondary | ICD-10-CM | POA: Diagnosis not present

## 2019-04-20 ENCOUNTER — Other Ambulatory Visit: Payer: Self-pay

## 2019-04-20 NOTE — Patient Outreach (Signed)
Whitney [name]) Care Management  04/20/2019  Whitney [name] 1930-04-30 [id number]   Medication Adherence call to [name] Telephone call to Patient regarding Medication Adherence unable to reach patient. [name] is showing past due on Atorvastatin 20 mg under [place].   [facility] Management Direct Dial [phone]  Fax [phone] [email or web address]

## 2019-06-01 DIAGNOSIS — Z23 Encounter for immunization: Secondary | ICD-10-CM | POA: Diagnosis not present

## 2019-07-04 DIAGNOSIS — Z87310 Personal history of (healed) osteoporosis fracture: Secondary | ICD-10-CM | POA: Diagnosis not present

## 2019-07-04 DIAGNOSIS — M810 Age-related osteoporosis without current pathological fracture: Secondary | ICD-10-CM | POA: Diagnosis not present

## 2019-07-04 DIAGNOSIS — I10 Essential (primary) hypertension: Secondary | ICD-10-CM | POA: Diagnosis not present

## 2019-07-04 DIAGNOSIS — E7849 Other hyperlipidemia: Secondary | ICD-10-CM | POA: Diagnosis not present

## 2019-07-22 DIAGNOSIS — E559 Vitamin D deficiency, unspecified: Secondary | ICD-10-CM | POA: Diagnosis not present

## 2019-07-22 DIAGNOSIS — Z6823 Body mass index (BMI) 23.0-23.9, adult: Secondary | ICD-10-CM | POA: Diagnosis not present

## 2019-07-22 DIAGNOSIS — G894 Chronic pain syndrome: Secondary | ICD-10-CM | POA: Diagnosis not present

## 2019-09-03 DIAGNOSIS — M810 Age-related osteoporosis without current pathological fracture: Secondary | ICD-10-CM | POA: Diagnosis not present

## 2019-09-03 DIAGNOSIS — G894 Chronic pain syndrome: Secondary | ICD-10-CM | POA: Diagnosis not present

## 2019-09-03 DIAGNOSIS — I2510 Atherosclerotic heart disease of native coronary artery without angina pectoris: Secondary | ICD-10-CM | POA: Diagnosis not present

## 2019-10-04 DIAGNOSIS — I2510 Atherosclerotic heart disease of native coronary artery without angina pectoris: Secondary | ICD-10-CM | POA: Diagnosis not present

## 2019-10-04 DIAGNOSIS — M810 Age-related osteoporosis without current pathological fracture: Secondary | ICD-10-CM | POA: Diagnosis not present

## 2019-10-04 DIAGNOSIS — I10 Essential (primary) hypertension: Secondary | ICD-10-CM | POA: Diagnosis not present

## 2019-11-01 DIAGNOSIS — I2510 Atherosclerotic heart disease of native coronary artery without angina pectoris: Secondary | ICD-10-CM | POA: Diagnosis not present

## 2019-11-01 DIAGNOSIS — M810 Age-related osteoporosis without current pathological fracture: Secondary | ICD-10-CM | POA: Diagnosis not present

## 2019-11-01 DIAGNOSIS — I10 Essential (primary) hypertension: Secondary | ICD-10-CM | POA: Diagnosis not present

## 2019-11-04 ENCOUNTER — Telehealth: Payer: Medicare Other | Admitting: Cardiology

## 2019-11-10 DIAGNOSIS — Z1389 Encounter for screening for other disorder: Secondary | ICD-10-CM | POA: Diagnosis not present

## 2019-11-10 DIAGNOSIS — Z0000 Encounter for general adult medical examination without abnormal findings: Secondary | ICD-10-CM | POA: Diagnosis not present

## 2019-11-10 DIAGNOSIS — I10 Essential (primary) hypertension: Secondary | ICD-10-CM | POA: Diagnosis not present

## 2019-11-10 DIAGNOSIS — Z6824 Body mass index (BMI) 24.0-24.9, adult: Secondary | ICD-10-CM | POA: Diagnosis not present

## 2019-11-10 DIAGNOSIS — G894 Chronic pain syndrome: Secondary | ICD-10-CM | POA: Diagnosis not present

## 2019-11-27 ENCOUNTER — Ambulatory Visit: Payer: Medicare Other | Attending: Internal Medicine

## 2019-11-27 DIAGNOSIS — Z23 Encounter for immunization: Secondary | ICD-10-CM

## 2019-11-27 NOTE — Progress Notes (Signed)
   Covid-19 Vaccination Clinic  Name:  Whitney [name]    MRN: [id number] DOB: 1929-11-03  11/27/2019  [name] was observed post Covid-19 immunization for 15 minutes without incident. She was provided with Vaccine Information Sheet and instruction to access the V-Safe system.   [name] was instructed to call 911 with any severe reactions post vaccine: [name] Difficulty breathing  . Swelling of face and throat  . A fast heartbeat  . A bad rash all over body  . Dizziness and weakness   Immunizations Administered    Name Date Dose VIS Date Route   Moderna COVID-19 Vaccine 11/27/2019 12:54 PM 0.5 mL 08/04/2019 Intramuscular   Manufacturer: Moderna   Lot: [id number]   NDC: 95638-756-43

## 2019-12-02 DIAGNOSIS — M810 Age-related osteoporosis without current pathological fracture: Secondary | ICD-10-CM | POA: Diagnosis not present

## 2019-12-02 DIAGNOSIS — I10 Essential (primary) hypertension: Secondary | ICD-10-CM | POA: Diagnosis not present

## 2019-12-02 DIAGNOSIS — I2510 Atherosclerotic heart disease of native coronary artery without angina pectoris: Secondary | ICD-10-CM | POA: Diagnosis not present

## 2019-12-30 ENCOUNTER — Other Ambulatory Visit: Payer: Self-pay

## 2019-12-30 ENCOUNTER — Ambulatory Visit: Payer: Medicare Other | Attending: Internal Medicine

## 2019-12-30 DIAGNOSIS — Z23 Encounter for immunization: Secondary | ICD-10-CM

## 2019-12-30 NOTE — Progress Notes (Signed)
   Covid-19 Vaccination Clinic  Name:  Whitney [name]    MRN: [id number] DOB: 12/03/1929  12/30/2019  [name] was observed post Covid-19 immunization for 15 minutes without incident. She was provided with Vaccine Information Sheet and instruction to access the V-Safe system.   [name] was instructed to call 911 with any severe reactions post vaccine: [name] Difficulty breathing  . Swelling of face and throat  . A fast heartbeat  . A bad rash all over body  . Dizziness and weakness   Immunizations Administered    Name Date Dose VIS Date Route   Moderna COVID-19 Vaccine 12/30/2019 11:49 AM 0.5 mL 08/2019 Intramuscular   Manufacturer: Moderna   Lot: [id number]   NDC: 49753-005-11

## 2020-02-01 DIAGNOSIS — I10 Essential (primary) hypertension: Secondary | ICD-10-CM | POA: Diagnosis not present

## 2020-02-01 DIAGNOSIS — M810 Age-related osteoporosis without current pathological fracture: Secondary | ICD-10-CM | POA: Diagnosis not present

## 2020-02-01 DIAGNOSIS — I2510 Atherosclerotic heart disease of native coronary artery without angina pectoris: Secondary | ICD-10-CM | POA: Diagnosis not present

## 2020-03-02 DIAGNOSIS — I2510 Atherosclerotic heart disease of native coronary artery without angina pectoris: Secondary | ICD-10-CM | POA: Diagnosis not present

## 2020-03-02 DIAGNOSIS — I10 Essential (primary) hypertension: Secondary | ICD-10-CM | POA: Diagnosis not present

## 2020-03-02 DIAGNOSIS — M810 Age-related osteoporosis without current pathological fracture: Secondary | ICD-10-CM | POA: Diagnosis not present

## 2020-03-24 DIAGNOSIS — I10 Essential (primary) hypertension: Secondary | ICD-10-CM | POA: Diagnosis not present

## 2020-03-24 DIAGNOSIS — G894 Chronic pain syndrome: Secondary | ICD-10-CM | POA: Diagnosis not present

## 2020-03-24 DIAGNOSIS — E119 Type 2 diabetes mellitus without complications: Secondary | ICD-10-CM | POA: Diagnosis not present

## 2020-03-24 DIAGNOSIS — Z6823 Body mass index (BMI) 23.0-23.9, adult: Secondary | ICD-10-CM | POA: Diagnosis not present

## 2020-03-24 DIAGNOSIS — E7849 Other hyperlipidemia: Secondary | ICD-10-CM | POA: Diagnosis not present

## 2020-04-01 DIAGNOSIS — I2510 Atherosclerotic heart disease of native coronary artery without angina pectoris: Secondary | ICD-10-CM | POA: Diagnosis not present

## 2020-04-01 DIAGNOSIS — I10 Essential (primary) hypertension: Secondary | ICD-10-CM | POA: Diagnosis not present

## 2020-04-01 DIAGNOSIS — M810 Age-related osteoporosis without current pathological fracture: Secondary | ICD-10-CM | POA: Diagnosis not present

## 2020-04-04 DIAGNOSIS — E119 Type 2 diabetes mellitus without complications: Secondary | ICD-10-CM | POA: Diagnosis not present

## 2020-04-04 DIAGNOSIS — G894 Chronic pain syndrome: Secondary | ICD-10-CM | POA: Diagnosis not present

## 2020-04-04 DIAGNOSIS — I10 Essential (primary) hypertension: Secondary | ICD-10-CM | POA: Diagnosis not present

## 2020-04-04 DIAGNOSIS — E782 Mixed hyperlipidemia: Secondary | ICD-10-CM | POA: Diagnosis not present

## 2020-04-05 DIAGNOSIS — E782 Mixed hyperlipidemia: Secondary | ICD-10-CM | POA: Diagnosis not present

## 2020-05-03 DIAGNOSIS — I10 Essential (primary) hypertension: Secondary | ICD-10-CM | POA: Diagnosis not present

## 2020-05-03 DIAGNOSIS — M810 Age-related osteoporosis without current pathological fracture: Secondary | ICD-10-CM | POA: Diagnosis not present

## 2020-05-03 DIAGNOSIS — I2510 Atherosclerotic heart disease of native coronary artery without angina pectoris: Secondary | ICD-10-CM | POA: Diagnosis not present

## 2020-06-02 DIAGNOSIS — I10 Essential (primary) hypertension: Secondary | ICD-10-CM | POA: Diagnosis not present

## 2020-06-02 DIAGNOSIS — M810 Age-related osteoporosis without current pathological fracture: Secondary | ICD-10-CM | POA: Diagnosis not present

## 2020-06-02 DIAGNOSIS — I2510 Atherosclerotic heart disease of native coronary artery without angina pectoris: Secondary | ICD-10-CM | POA: Diagnosis not present

## 2020-06-16 DIAGNOSIS — Z23 Encounter for immunization: Secondary | ICD-10-CM | POA: Diagnosis not present

## 2020-07-02 DIAGNOSIS — I10 Essential (primary) hypertension: Secondary | ICD-10-CM | POA: Diagnosis not present

## 2020-07-02 DIAGNOSIS — M810 Age-related osteoporosis without current pathological fracture: Secondary | ICD-10-CM | POA: Diagnosis not present

## 2020-07-02 DIAGNOSIS — I2510 Atherosclerotic heart disease of native coronary artery without angina pectoris: Secondary | ICD-10-CM | POA: Diagnosis not present

## 2020-07-21 DIAGNOSIS — I10 Essential (primary) hypertension: Secondary | ICD-10-CM | POA: Diagnosis not present

## 2020-07-21 DIAGNOSIS — G894 Chronic pain syndrome: Secondary | ICD-10-CM | POA: Diagnosis not present

## 2020-07-21 DIAGNOSIS — Z6823 Body mass index (BMI) 23.0-23.9, adult: Secondary | ICD-10-CM | POA: Diagnosis not present

## 2020-08-02 DIAGNOSIS — I2510 Atherosclerotic heart disease of native coronary artery without angina pectoris: Secondary | ICD-10-CM | POA: Diagnosis not present

## 2020-08-02 DIAGNOSIS — M810 Age-related osteoporosis without current pathological fracture: Secondary | ICD-10-CM | POA: Diagnosis not present

## 2020-08-02 DIAGNOSIS — I10 Essential (primary) hypertension: Secondary | ICD-10-CM | POA: Diagnosis not present

## 2020-09-02 DIAGNOSIS — I2510 Atherosclerotic heart disease of native coronary artery without angina pectoris: Secondary | ICD-10-CM | POA: Diagnosis not present

## 2020-09-02 DIAGNOSIS — I10 Essential (primary) hypertension: Secondary | ICD-10-CM | POA: Diagnosis not present

## 2020-09-02 DIAGNOSIS — M810 Age-related osteoporosis without current pathological fracture: Secondary | ICD-10-CM | POA: Diagnosis not present

## 2020-11-22 DIAGNOSIS — Z0000 Encounter for general adult medical examination without abnormal findings: Secondary | ICD-10-CM | POA: Diagnosis not present

## 2020-11-22 DIAGNOSIS — Z6823 Body mass index (BMI) 23.0-23.9, adult: Secondary | ICD-10-CM | POA: Diagnosis not present

## 2020-11-23 DIAGNOSIS — H0102B Squamous blepharitis left eye, upper and lower eyelids: Secondary | ICD-10-CM | POA: Diagnosis not present

## 2020-11-23 DIAGNOSIS — H0102A Squamous blepharitis right eye, upper and lower eyelids: Secondary | ICD-10-CM | POA: Diagnosis not present

## 2020-11-23 DIAGNOSIS — H353131 Nonexudative age-related macular degeneration, bilateral, early dry stage: Secondary | ICD-10-CM | POA: Diagnosis not present

## 2020-11-23 DIAGNOSIS — Z961 Presence of intraocular lens: Secondary | ICD-10-CM | POA: Diagnosis not present

## 2020-11-23 DIAGNOSIS — H02831 Dermatochalasis of right upper eyelid: Secondary | ICD-10-CM | POA: Diagnosis not present

## 2020-11-23 DIAGNOSIS — H02834 Dermatochalasis of left upper eyelid: Secondary | ICD-10-CM | POA: Diagnosis not present

## 2020-11-23 DIAGNOSIS — H04123 Dry eye syndrome of bilateral lacrimal glands: Secondary | ICD-10-CM | POA: Diagnosis not present

## 2020-12-31 DIAGNOSIS — I10 Essential (primary) hypertension: Secondary | ICD-10-CM | POA: Diagnosis not present

## 2020-12-31 DIAGNOSIS — M810 Age-related osteoporosis without current pathological fracture: Secondary | ICD-10-CM | POA: Diagnosis not present

## 2020-12-31 DIAGNOSIS — I2510 Atherosclerotic heart disease of native coronary artery without angina pectoris: Secondary | ICD-10-CM | POA: Diagnosis not present

## 2021-01-31 DIAGNOSIS — I2510 Atherosclerotic heart disease of native coronary artery without angina pectoris: Secondary | ICD-10-CM | POA: Diagnosis not present

## 2021-01-31 DIAGNOSIS — M810 Age-related osteoporosis without current pathological fracture: Secondary | ICD-10-CM | POA: Diagnosis not present

## 2021-01-31 DIAGNOSIS — I10 Essential (primary) hypertension: Secondary | ICD-10-CM | POA: Diagnosis not present

## 2021-02-08 ENCOUNTER — Other Ambulatory Visit: Payer: Self-pay

## 2021-02-08 ENCOUNTER — Emergency Department (HOSPITAL_COMMUNITY): Payer: Medicare Other

## 2021-02-08 ENCOUNTER — Observation Stay (HOSPITAL_COMMUNITY)
Admission: EM | Admit: 2021-02-08 | Discharge: 2021-02-08 | Disposition: A | Discharge status: Home / Self Care | Payer: Medicare Other | Attending: Neurology | Admitting: Neurology

## 2021-02-08 DIAGNOSIS — I619 Nontraumatic intracerebral hemorrhage, unspecified: Secondary | ICD-10-CM | POA: Diagnosis not present

## 2021-02-08 DIAGNOSIS — Z7982 Long term (current) use of aspirin: Secondary | ICD-10-CM | POA: Diagnosis not present

## 2021-02-08 DIAGNOSIS — I169 Hypertensive crisis, unspecified: Secondary | ICD-10-CM | POA: Diagnosis not present

## 2021-02-08 DIAGNOSIS — Z87891 Personal history of nicotine dependence: Secondary | ICD-10-CM | POA: Insufficient documentation

## 2021-02-08 DIAGNOSIS — Z79899 Other long term (current) drug therapy: Secondary | ICD-10-CM | POA: Insufficient documentation

## 2021-02-08 DIAGNOSIS — K920 Hematemesis: Secondary | ICD-10-CM | POA: Diagnosis not present

## 2021-02-08 DIAGNOSIS — Z4682 Encounter for fitting and adjustment of non-vascular catheter: Secondary | ICD-10-CM | POA: Diagnosis not present

## 2021-02-08 DIAGNOSIS — R404 Transient alteration of awareness: Secondary | ICD-10-CM | POA: Diagnosis not present

## 2021-02-08 DIAGNOSIS — Z20822 Contact with and (suspected) exposure to covid-19: Secondary | ICD-10-CM | POA: Diagnosis not present

## 2021-02-08 DIAGNOSIS — R092 Respiratory arrest: Secondary | ICD-10-CM | POA: Diagnosis not present

## 2021-02-08 DIAGNOSIS — I629 Nontraumatic intracranial hemorrhage, unspecified: Principal | ICD-10-CM | POA: Insufficient documentation

## 2021-02-08 DIAGNOSIS — R6889 Other general symptoms and signs: Secondary | ICD-10-CM | POA: Diagnosis not present

## 2021-02-08 DIAGNOSIS — I10 Essential (primary) hypertension: Secondary | ICD-10-CM | POA: Insufficient documentation

## 2021-02-08 DIAGNOSIS — I517 Cardiomegaly: Secondary | ICD-10-CM | POA: Diagnosis not present

## 2021-02-08 DIAGNOSIS — R4182 Altered mental status, unspecified: Secondary | ICD-10-CM | POA: Diagnosis present

## 2021-02-08 DIAGNOSIS — Z96642 Presence of left artificial hip joint: Secondary | ICD-10-CM | POA: Insufficient documentation

## 2021-02-08 DIAGNOSIS — R0603 Acute respiratory distress: Secondary | ICD-10-CM | POA: Diagnosis not present

## 2021-02-08 DIAGNOSIS — M50322 Other cervical disc degeneration at C5-C6 level: Secondary | ICD-10-CM | POA: Diagnosis not present

## 2021-02-08 DIAGNOSIS — M50323 Other cervical disc degeneration at C6-C7 level: Secondary | ICD-10-CM | POA: Diagnosis not present

## 2021-02-08 DIAGNOSIS — R4020 Unspecified coma: Secondary | ICD-10-CM | POA: Diagnosis not present

## 2021-02-08 DIAGNOSIS — R000 Tachycardia, unspecified: Secondary | ICD-10-CM | POA: Diagnosis not present

## 2021-02-08 DIAGNOSIS — Z743 Need for continuous supervision: Secondary | ICD-10-CM | POA: Diagnosis not present

## 2021-02-08 DIAGNOSIS — I499 Cardiac arrhythmia, unspecified: Secondary | ICD-10-CM | POA: Diagnosis not present

## 2021-02-08 DIAGNOSIS — M50321 Other cervical disc degeneration at C4-C5 level: Secondary | ICD-10-CM | POA: Diagnosis not present

## 2021-02-08 DIAGNOSIS — R918 Other nonspecific abnormal finding of lung field: Secondary | ICD-10-CM | POA: Diagnosis not present

## 2021-02-08 LAB — OCCULT BLOOD GASTRIC / DUODENUM (SPECIMEN CUP)
Occult Blood, Gastric: POSITIVE — AB
pH, Gastric: 3

## 2021-02-08 LAB — COMPREHENSIVE METABOLIC PANEL
ALT: <5 U/L (ref 0–44)
AST: 71 U/L — ABNORMAL HIGH (ref 15–41)
Albumin: 4.4 g/dL (ref 3.5–5.0)
Alkaline Phosphatase: 71 U/L (ref 38–126)
Anion gap: 17 — ABNORMAL HIGH (ref 5–15)
BUN: 16 mg/dL (ref 8–23)
CO2: 28 mmol/L (ref 22–32)
Calcium: 9.6 mg/dL (ref 8.9–10.3)
Chloride: 84 mmol/L — ABNORMAL LOW (ref 98–111)
Creatinine, Ser: 0.81 mg/dL (ref 0.44–1.00)
GFR, Estimated: >60 mL/min (ref >60)
Glucose, Bld: 204 mg/dL — ABNORMAL HIGH (ref 70–99)
Potassium: 3.4 mmol/L — ABNORMAL LOW (ref 3.5–5.1)
Sodium: 129 mmol/L — ABNORMAL LOW (ref 135–145)
Total Bilirubin: 1 mg/dL (ref 0.3–1.2)
Total Protein: 8.5 g/dL — ABNORMAL HIGH (ref 6.5–8.1)

## 2021-02-08 LAB — BLOOD GAS, ARTERIAL
Acid-Base Excess: 4.8 mmol/L — ABNORMAL HIGH (ref 0.0–2.0)
Bicarbonate: 27.9 mmol/L (ref 20.0–28.0)
FIO2: 100
O2 Saturation: 99.6 %
Patient temperature: 36.3
pCO2 arterial: 48.2 mmHg — ABNORMAL HIGH (ref 32.0–48.0)
pH, Arterial: 7.4 (ref 7.350–7.450)
pO2, Arterial: 408 mmHg — ABNORMAL HIGH (ref 83.0–108.0)

## 2021-02-08 LAB — POC OCCULT BLOOD, ED: Fecal Occult Bld: NEGATIVE

## 2021-02-08 LAB — RETICULOCYTES
Immature Retic Fract: 7.2 % (ref 2.3–15.9)
RBC.: 4.87 MIL/uL (ref 3.87–5.11)
Retic Count, Absolute: 70.6 10*3/uL (ref 19.0–186.0)
Retic Ct Pct: 1.5 % (ref 0.4–3.1)

## 2021-02-08 LAB — CBC WITH DIFFERENTIAL/PLATELET
Abs Immature Granulocytes: 0.08 10*3/uL — ABNORMAL HIGH (ref 0.00–0.07)
Basophils Absolute: 0.1 10*3/uL (ref 0.0–0.1)
Basophils Relative: 1 %
Eosinophils Absolute: 0.6 10*3/uL — ABNORMAL HIGH (ref 0.0–0.5)
Eosinophils Relative: 4 %
HCT: 44.3 % (ref 36.0–46.0)
Hemoglobin: 15.5 g/dL — ABNORMAL HIGH (ref 12.0–15.0)
Immature Granulocytes: 1 %
Lymphocytes Relative: 6 %
Lymphs Abs: 0.9 10*3/uL (ref 0.7–4.0)
MCH: 31.8 pg (ref 26.0–34.0)
MCHC: 35 g/dL (ref 30.0–36.0)
MCV: 91 fL (ref 80.0–100.0)
Monocytes Absolute: 0.9 10*3/uL (ref 0.1–1.0)
Monocytes Relative: 6 %
Neutro Abs: 13 10*3/uL — ABNORMAL HIGH (ref 1.7–7.7)
Neutrophils Relative %: 82 %
Platelets: 326 10*3/uL (ref 150–400)
RBC: 4.87 MIL/uL (ref 3.87–5.11)
RDW: 12.2 % (ref 11.5–15.5)
WBC: 15.4 10*3/uL — ABNORMAL HIGH (ref 4.0–10.5)
nRBC: 0 % (ref 0.0–0.2)

## 2021-02-08 LAB — RESP PANEL BY RT-PCR (FLU A&B, COVID) ARPGX2
Influenza A by PCR: NEGATIVE
Influenza B by PCR: NEGATIVE
SARS Coronavirus 2 by RT PCR: NEGATIVE

## 2021-02-08 LAB — TYPE AND SCREEN
ABO/RH(D): A NEG
Antibody Screen: NEGATIVE

## 2021-02-08 LAB — VITAMIN B12: Vitamin B-12: 322 pg/mL (ref 180–914)

## 2021-02-08 LAB — FOLATE: Folate: 41.3 ng/mL (ref >5.9)

## 2021-02-08 LAB — IRON AND TIBC
Iron: 22 ug/dL — ABNORMAL LOW (ref 28–170)
Saturation Ratios: 7 % — ABNORMAL LOW (ref 10.4–31.8)
TIBC: 335 ug/dL (ref 250–450)
UIBC: 313 ug/dL

## 2021-02-08 LAB — FERRITIN: Ferritin: 233 ng/mL (ref 11–307)

## 2021-02-08 LAB — TROPONIN I (HIGH SENSITIVITY): Troponin I (High Sensitivity): 3565 ng/L (ref <18)

## 2021-02-08 LAB — LACTIC ACID, PLASMA: Lactic Acid, Venous: 6.7 mmol/L (ref 0.5–1.9)

## 2021-02-08 MED ORDER — ROCURONIUM BROMIDE 50 MG/5ML IV SOLN
50.0000 mg | Freq: Once | INTRAVENOUS | Status: AC
  Administered 2021-02-08: 12:00:00 50 mg via INTRAVENOUS

## 2021-02-08 MED ORDER — MORPHINE SULFATE (PF) 4 MG/ML IV SOLN
4.0000 mg | INTRAVENOUS | Status: DC | PRN
  Administered 2021-02-08: 4 mg via INTRAVENOUS
  Filled 2021-02-08: qty 1

## 2021-02-08 MED ORDER — PROPOFOL 1000 MG/100ML IV EMUL
5.0000 ug/kg/min | INTRAVENOUS | Status: DC
  Administered 2021-02-08: 5 ug/kg/min via INTRAVENOUS
  Filled 2021-02-08: qty 100

## 2021-02-08 MED ORDER — LORAZEPAM 2 MG/ML IJ SOLN
2.0000 mg | INTRAMUSCULAR | Status: DC | PRN
  Administered 2021-02-08: 2 mg via INTRAVENOUS
  Filled 2021-02-08: qty 1

## 2021-02-08 MED ORDER — CLEVIDIPINE BUTYRATE 0.5 MG/ML IV EMUL
0.0000 mg/h | INTRAVENOUS | Status: DC
  Administered 2021-02-08: 1 mg/h via INTRAVENOUS
  Filled 2021-02-08: qty 50

## 2021-02-08 MED ORDER — SODIUM CHLORIDE 0.9 % IV SOLN
50.0000 ug/h | INTRAVENOUS | Status: DC
  Administered 2021-02-08: 50 ug/h via INTRAVENOUS
  Filled 2021-02-08 (×3): qty 1

## 2021-02-08 MED ORDER — SODIUM CHLORIDE 0.9 % IV SOLN: INTRAVENOUS | Status: DC

## 2021-02-08 MED ORDER — ETOMIDATE 2 MG/ML IV SOLN
20.0000 mg | Freq: Once | INTRAVENOUS | Status: AC
  Administered 2021-02-08: 12:00:00 20 mg via INTRAVENOUS

## 2021-02-08 MED ORDER — OCTREOTIDE LOAD VIA INFUSION
100.0000 ug | Freq: Once | INTRAVENOUS | Status: AC
  Administered 2021-02-08: 100 ug via INTRAVENOUS
  Filled 2021-02-08: qty 50

## 2021-03-03 NOTE — ED Provider Notes (Signed)
Patient's time of death is 7:07 PM.  Dr. Eber Hong will take care of the death certificate   Bethann Berkshire, MD 02/11/21 Windell Moment

## 2021-03-03 NOTE — ED Triage Notes (Signed)
Last seen yesterday afternoon and POA checked on patient this morning and patient was lying on porch and was leaning on right ear with lividity. Patient has coffee ground emesis with gurgling. Patient unresponsive with gag relax and had right sided deviation in triage. CBG 240

## 2021-03-03 NOTE — ED Triage Notes (Signed)
Dr. Hyacinth Meeker at bedside with staff assisting for intubation. Patient suction prior to intubation.

## 2021-03-03 NOTE — Progress Notes (Signed)
Patient was extubated at 1600hr per MDs order. Patient was placed on 2L O2 via nasal cannula. Patient is comfort care

## 2021-03-03 NOTE — ED Notes (Signed)
Time of death 83. Verified by MD and two RN's. Absent heart sounds auscultated by Enzo Montgomery, RN & Arelia Longest, RN.

## 2021-03-03 NOTE — ED Provider Notes (Addendum)
[facility] EMERGENCY DEPARTMENT Provider Note   CSN: [id number] Arrival date & time: 03-03-21  1217     History Chief Complaint  Patient presents with  . Respiratory Arrest  . Altered Mental Status    Whitney [name] is a 87 y.o. female.  HPI   This patient is an 87 year old female, she presents to the hospital today unresponsive, she was found on her back porch on the ground laying in a pool of what appeared to be coffee-ground emesis.  The patient is not able to give me any information, level 5 caveat applies.  Paramedics report that the patient was hypotensive prehospital, they were unable to intubate the patient secondary to her jaw being clenched but were able to place a nasopharyngeal airway and applied a nonrebreather, she was breathing spontaneously but inadequately.    Past Medical History:  Diagnosis Date  . Hyperlipidemia   . Hypertension   . Tobacco abuse    discontinued tobacco abuse 12/25/13    Patient Active Problem List   Diagnosis Date Noted  . ICH (intracerebral hemorrhage) ([place]) 03-03-21  . Unspecified essential hypertension 01/04/2014  . Senile osteoporosis 01/04/2014  . Acute blood loss anemia 12/28/2013  . Acute respiratory failure with hypoxia ([place]) 12/28/2013  . Fracture of femoral neck, left, closed ([place]) 12/25/2013  . Tobacco abuse 12/25/2013  . Essential hypertension 08/05/2013  . Hyperlipidemia 08/05/2013    Past Surgical History:  Procedure Laterality Date  . HIP ARTHROPLASTY Left 12/26/2013   Procedure: ARTHROPLASTY BIPOLAR HIP LEFT;  Surgeon: Sanjuana Kava, MD;  Location: [facility];  Service: Orthopedics;  Laterality: Left;     OB History   No obstetric history on file.     Family History  Problem Relation Age of Onset  . Diabetes Sister     Social History   Tobacco Use  . Smoking status: Former Smoker    Packs/day: 1.00    Years: 50.00    Pack years: 50.00    Types: Cigarettes  . Smokeless tobacco: Never Used   Substance Use Topics  . Alcohol use: No    Alcohol/week: 0.0 standard drinks  . Drug use: No    Home Medications Prior to Admission medications   Medication Sig Start Date End Date Taking? Authorizing Provider  aspirin EC 81 MG tablet Take 81 mg by mouth every morning.    Yes [provider]  atorvastatin (LIPITOR) 20 MG tablet Take 10 mg by mouth at bedtime.    Yes [provider]  BENICAR 40 MG tablet Take 40 mg by mouth daily. 07/23/13  Yes [provider]  Cholecalciferol (VITAMIN D3) 125 MCG (5000 UT) TABS Take 1 tablet by mouth daily.   Yes [provider]  cloNIDine (CATAPRES) 0.1 MG tablet Take 0.1-0.2 mg by mouth 2 (two) times daily. Two tablets taken in the morning and one tablet at super 07/14/13  Yes [provider]  diazepam (VALIUM) 5 MG tablet Take 1 tablet (5 mg total) by mouth daily as needed for anxiety. 12/31/13  Yes Lauree Chandler, NP  doxazosin (CARDURA) 2 MG tablet Take 2 mg by mouth at bedtime. 07/14/13  Yes [provider]  hydrochlorothiazide (HYDRODIURIL) 25 MG tablet Take 50 mg by mouth every morning. 06/22/13  Yes [provider]  HYDROcodone-acetaminophen (NORCO/VICODIN) 5-325 MG tablet Take 1 tablet by mouth every 6 (six) hours as needed for moderate pain.   Yes [provider]  metoprolol succinate (TOPROL-XL) 100 MG 24 hr tablet  Take 100 mg by mouth every morning.  07/14/13  Yes [provider]  Omega-3 Fatty Acids (FISH OIL) 1000 MG CAPS Take 1,000 mg by mouth at bedtime.   Yes [provider]  oxybutynin (DITROPAN) 5 MG tablet Take 5 mg by mouth 2 (two) times daily. 12/05/20  Yes [provider]  albuterol-ipratropium (COMBIVENT) 18-103 MCG/ACT inhaler Inhale 2 puffs into the lungs every 4 (four) hours as needed for wheezing or shortness of breath. Patient not taking: No sig reported 12/30/13   Rexene Alberts, MD  potassium chloride (K-DUR) 10 MEQ tablet Take 1  tablet (10 mEq total) by mouth daily. Patient not taking: No sig reported 12/30/13   Rexene Alberts, MD    Allergies    Prednisone  Review of Systems   Review of Systems  Unable to perform ROS: Acuity of condition    Physical Exam Updated Vital Signs BP (!) 201/128   Pulse (!) 108   Temp (!) 97.3 F (36.3 C)   Resp 17   Ht 1.6 m (_0 )   Wt 59 kg   SpO2 100%   BMI 23.03 kg/m   Physical Exam Constitutional:      Comments: Ill-appearing, responds to painful stimuli, she does not talk, does not open her eyes, jaws clenched  HENT:     Head: Normocephalic and atraumatic.     Nose: Nose normal. No congestion or rhinorrhea.     Mouth/Throat:     Comments: Cannot see the back of the pharynx but there does appear to be coffee-ground emesis on the lips and in her hair Eyes:     Comments: Pupils 1 to 2 mm symmetrical and minimally reactive  Neck:     Comments: Immobilized neck on arrival with cervical collar Cardiovascular:     Rate and Rhythm: Tachycardia present.     Pulses: Normal pulses.     Comments: Tachycardic to 110 Pulmonary:     Comments: Spontaneous respirations, shallow, tachypnea Abdominal:     Comments: Abdomen does not appear to be distended there is no masses  Musculoskeletal:     Comments: No obvious deformities of the 4 extremities  Skin:    Comments: No obvious significant skin breakdown  Neurological:     Comments: FlockPatient has clenched teeth, she does not follow commands, she appears to be a, there is occasional rigid tone and occasionally responds to painful stimuli, no speech unresponsive, she does respond to painful stimuli occasionally with her arms and legs.     ED Results / Procedures / Treatments   Labs (all labs ordered are listed, but only abnormal results are displayed) Labs Reviewed  CBC WITH DIFFERENTIAL/PLATELET - Abnormal; Notable for the following components:      Result Value   WBC 15.4 (*)    Hemoglobin 15.5 (*)    Neutro  Abs 13.0 (*)    Eosinophils Absolute 0.6 (*)    Abs Immature Granulocytes 0.08 (*)    All other components within normal limits  COMPREHENSIVE METABOLIC PANEL - Abnormal; Notable for the following components:   Sodium 129 (*)    Potassium 3.4 (*)    Chloride 84 (*)    Glucose, Bld 204 (*)    Total Protein 8.5 (*)    AST 71 (*)    Anion gap 17 (*)    All other components within normal limits  IRON AND TIBC - Abnormal; Notable for the following components:   Iron 22 (*)    Saturation  Ratios 7 (*)    All other components within normal limits  OCCULT BLOOD GASTRIC / DUODENUM (SPECIMEN CUP) - Abnormal; Notable for the following components:   Occult Blood, Gastric POSITIVE (*)    All other components within normal limits  BLOOD GAS, ARTERIAL - Abnormal; Notable for the following components:   pCO2 arterial 48.2 (*)    pO2, Arterial 408 (*)    Acid-Base Excess 4.8 (*)    All other components within normal limits  VITAMIN B12  FERRITIN  RETICULOCYTES  FOLATE  LACTIC ACID, PLASMA  POC OCCULT BLOOD, ED  TYPE AND SCREEN  TROPONIN I (HIGH SENSITIVITY)  TROPONIN I (HIGH SENSITIVITY)    EKG EKG Interpretation  Date/Time:  February 22, 2021 12:30:18 EDT Ventricular Rate:  117 PR Interval:  151 QRS Duration: 88 QT Interval:  354 QTC Calculation: 492 R Axis:   -49 Text Interpretation: Sinus tachycardia Left anterior fascicular block Anteroseptal infarct, age indeterminate ST elevation, consider inferior injury Baseline wander in lead(s) II aVF Confirmed by Noemi Chapel [phone]) on Feb 22, 2021 12:40:46 PM   Radiology DG Chest Port 1 View  Result Date: Feb 22, 2021 CLINICAL DATA:  Respiratory distress post intubation. EXAM: PORTABLE CHEST 1 VIEW COMPARISON:  December 25, 2013 FINDINGS: Endotracheal tube terminates 3.2 cm above the carina. Enteric catheter transverses the thorax, tip collimated off the image. Cardiomediastinal silhouette is normal. Calcific atherosclerotic disease and  tortuosity of the aorta. There is no evidence of pleural effusion or pneumothorax. Patchy airspace opacities in the left lower lung field. Osseous structures are without acute abnormality. Soft tissues are grossly normal. IMPRESSION: 1. Endotracheal tube terminates 3.2 cm above the carina. 2. Patchy airspace opacities in the left lower lung field may represent pneumonia or aspiration. Please follow-up to resolution. Electronically Signed   By: Fidela Salisbury M.D.   On: 22-Feb-2021 13:03    Procedures .Critical Care Performed by: Noemi Chapel, MD Authorized by: Noemi Chapel, MD   Critical care provider statement:    Critical care time (minutes):  35   Critical care time was exclusive of:  Separately billable procedures and treating other patients and teaching time   Critical care was necessary to treat or prevent imminent or life-threatening deterioration of the following conditions:  Respiratory failure   Critical care was time spent personally by me on the following activities:  Blood draw for specimens, development of treatment plan with patient or surrogate, discussions with consultants, evaluation of patient's response to treatment, examination of patient, obtaining history from patient or surrogate, ordering and performing treatments and interventions, ordering and review of laboratory studies, ordering and review of radiographic studies, pulse oximetry, re-evaluation of patient's condition and review of old charts Comments:        Procedure Name: Intubation Date/Time: 2021/02/22 12:37 PM Performed by: Noemi Chapel, MD Pre-anesthesia Checklist: Patient identified, Patient being monitored, Emergency Drugs available, Timeout performed and Suction available Oxygen Delivery Method: Non-rebreather mask Preoxygenation: Pre-oxygenation with 100% oxygen Induction Type: Rapid sequence Ventilation: Mask ventilation without difficulty Laryngoscope Size: Mac and 3 Tube size: 7.0 mm Number of  attempts: 1 Airway Equipment and Method: Stylet Placement Confirmation: ETT inserted through vocal cords under direct vision,  CO2 detector and Breath sounds checked- equal and bilateral Secured at: 24 cm Tube secured with: ETT holder Dental Injury: Teeth and Oropharynx as per pre-operative assessment  Difficulty Due To: Difficulty was unanticipated Comments:           Medications Ordered in ED Medications  0.9 %  sodium chloride infusion ( Intravenous New Bag/Given 2021-03-04 1307)  octreotide (SANDOSTATIN) 2 mcg/mL load via infusion 100 mcg (100 mcg Intravenous Bolus from Bag 03-04-2021 1309)    And  octreotide (SANDOSTATIN) 500 mcg in sodium chloride 0.9 % 250 mL (2 mcg/mL) infusion (50 mcg/hr Intravenous New Bag/Given 03-04-2021 1308)  propofol (DIPRIVAN) 1000 MG/100ML infusion (5 mcg/kg/min  59 kg Intravenous New Bag/Given 2021/03/04 1246)  clevidipine (CLEVIPREX) infusion 0.5 mg/mL (has no administration in time range)  etomidate (AMIDATE) injection 20 mg (20 mg Intravenous Given 2021-03-04 1225)  rocuronium (ZEMURON) injection 50 mg (50 mg Intravenous Given 04-Mar-2021 1227)    ED Course  I have reviewed the triage vital signs and the nursing notes.  Pertinent labs & imaging results that were available during my care of the patient were reviewed by me and considered in my medical decision making (see chart for details).    MDM Rules/Calculators/A&P                          This patient is ill-appearing, her EKG shows sinus tachycardia, she may have had a gastrointestinal bleed, this may be intracranial, there may be trauma from the fall including her cervical spine.  This may be related to medications that she is taking, this may have been a stroke, could have possibly been a seizure, it is undifferentiated at this time but gastrointestinal bleeding is high on the list.  She is on aspirin but no other anticoagulants.  Type and screen, labs, octreotide, the patient has been intubated, she may need  central line support as well.  Discussed the care with the neurosurgeon Dr. Joaquim Nam who recommends Neuro ICU - he will consult  Pt on cleviprex - drip   Will need Neuro ICU 4 north D/w Dr. Leonel Ramsay who accepts the pt in transfer Critically ill  A family member has now arrived who is the healthcare power of attorney, it appears that it is the patient's niece.  She presents with paperwork and states that the patient would never want to be on a ventilator.  This information was not available on her arrival.  She requested the patient be taken off of ventilator and made comfortable with sedation and pain medicines.  She understands that the significance of the intracranial hemorrhage is such that the patient will never live in independent life and would be lucky if she survives this bleed.  At this time I have been asked to take the patient off the ventilator by the family member.  She will be given Ativan and morphine and the endotracheal tube will be removed by respiratory.  The patient has been extubated, family members are at the bedside I discussed the case with Dr. Waldron Labs who will admit the patient if the patient does not pass quickly  Final Clinical Impression(s) / ED Diagnoses Final diagnoses:  Intracranial hemorrhage ([place])  Hypertensive crisis      Noemi Chapel, MD 03-04-21 1433    Noemi Chapel, MD 03-04-21 [phone]

## 2021-03-03 NOTE — ED Notes (Signed)
Pt extubated by RT per MD order.

## 2021-03-03 NOTE — Progress Notes (Signed)
Pt presented with AMS and hematemesis.  Found to have large ICH with hypertension.  Required intubation for airway protection.  Family arrived and discussed with Dr. Hyacinth Meeker.  Family in agreement that Ms. Robart would not want aggressive therapies given poor neuro prognosis.  Decision made to transition to comfort care.  Dr. Hyacinth Meeker will arrange for extubation and consult hospitalist if she needs admission for palliative/hospice care.  Dr. Hyacinth Meeker will notify neurology about events and updated plan.  Please call if further assistance needed from PCCM.  Coralyn Helling, MD [facility] Pulmonary/Critical Care Pager - [phone] 02-25-21, 3:33 PM

## 2021-03-03 NOTE — ED Notes (Signed)
Per MD , stop drips and pt will be placed on comfort care.

## 2021-03-03 NOTE — ED Notes (Signed)
Family at bedside at this time with patient.

## 2021-03-03 DEATH — deceased
